# Patient Record
Sex: Female | Born: 1935 | Race: White | Hispanic: No | Smoking: Former smoker
Health system: Southern US, Community
[De-identification: ages and names within clinical notes are randomized; demographics above are authoritative.]

## PROBLEM LIST (undated history)

## (undated) DIAGNOSIS — F32A Depression, unspecified: Secondary | ICD-10-CM

## (undated) DIAGNOSIS — I10 Essential (primary) hypertension: Secondary | ICD-10-CM

## (undated) DIAGNOSIS — Z8673 Personal history of transient ischemic attack (TIA), and cerebral infarction without residual deficits: Secondary | ICD-10-CM

## (undated) DIAGNOSIS — K5730 Diverticulosis of large intestine without perforation or abscess without bleeding: Secondary | ICD-10-CM

## (undated) DIAGNOSIS — F0390 Unspecified dementia without behavioral disturbance: Secondary | ICD-10-CM

## (undated) DIAGNOSIS — L408 Other psoriasis: Secondary | ICD-10-CM

## (undated) DIAGNOSIS — F329 Major depressive disorder, single episode, unspecified: Secondary | ICD-10-CM

## (undated) HISTORY — DX: Personal history of transient ischemic attack (TIA), and cerebral infarction without residual deficits: Z86.73

## (undated) HISTORY — DX: Unspecified dementia, unspecified severity, without behavioral disturbance, psychotic disturbance, mood disturbance, and anxiety: F03.90

## (undated) HISTORY — DX: Depression, unspecified: F32.A

## (undated) HISTORY — DX: Essential (primary) hypertension: I10

## (undated) HISTORY — DX: Major depressive disorder, single episode, unspecified: F32.9

## (undated) HISTORY — DX: Other psoriasis: L40.8

## (undated) HISTORY — DX: Diverticulosis of large intestine without perforation or abscess without bleeding: K57.30

---

## 1998-02-26 ENCOUNTER — Emergency Department (HOSPITAL_COMMUNITY): Admission: EM | Admit: 1998-02-26 | Discharge: 1998-02-26 | Payer: Self-pay | Admitting: Emergency Medicine

## 2006-04-10 ENCOUNTER — Emergency Department (HOSPITAL_COMMUNITY): Admission: EM | Admit: 2006-04-10 | Discharge: 2006-04-11 | Payer: Self-pay | Admitting: Emergency Medicine

## 2007-01-11 ENCOUNTER — Ambulatory Visit: Payer: Self-pay | Admitting: Endocrinology

## 2007-01-25 ENCOUNTER — Ambulatory Visit: Payer: Self-pay | Admitting: Endocrinology

## 2007-02-08 ENCOUNTER — Ambulatory Visit: Payer: Self-pay | Admitting: Endocrinology

## 2007-03-19 ENCOUNTER — Ambulatory Visit: Payer: Self-pay | Admitting: Endocrinology

## 2007-05-21 ENCOUNTER — Ambulatory Visit: Payer: Self-pay | Admitting: Endocrinology

## 2007-05-23 DIAGNOSIS — I10 Essential (primary) hypertension: Secondary | ICD-10-CM | POA: Insufficient documentation

## 2007-05-23 DIAGNOSIS — F329 Major depressive disorder, single episode, unspecified: Secondary | ICD-10-CM

## 2007-05-23 DIAGNOSIS — K5730 Diverticulosis of large intestine without perforation or abscess without bleeding: Secondary | ICD-10-CM | POA: Insufficient documentation

## 2007-05-23 DIAGNOSIS — F068 Other specified mental disorders due to known physiological condition: Secondary | ICD-10-CM

## 2007-05-23 DIAGNOSIS — Z8679 Personal history of other diseases of the circulatory system: Secondary | ICD-10-CM | POA: Insufficient documentation

## 2007-07-27 ENCOUNTER — Encounter: Payer: Self-pay | Admitting: Endocrinology

## 2007-07-27 ENCOUNTER — Ambulatory Visit: Payer: Self-pay | Admitting: Endocrinology

## 2007-10-05 ENCOUNTER — Ambulatory Visit: Payer: Self-pay | Admitting: Endocrinology

## 2007-10-05 IMAGING — CR DG ELBOW COMPLETE 3+V*L*
4 series · 4 of 4 positions shown · non-contrast
Comparison: none

CLINICAL DATA: Posterior left elbow pain and bruising following a fall.

LEFT ELBOW - 4 VIEW

[view not recorded (1 of 4)]
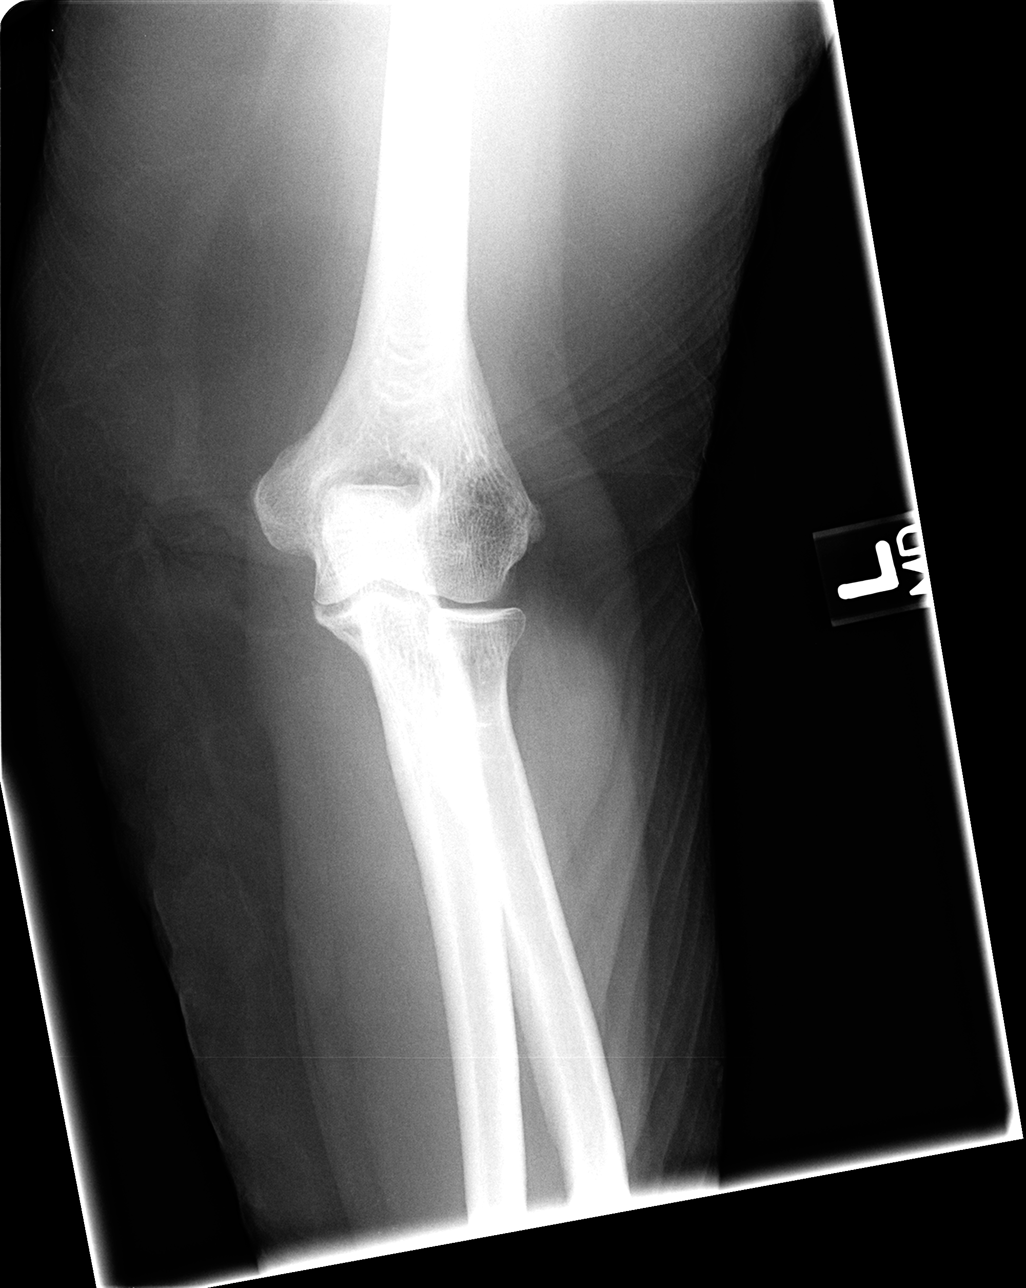

[view not recorded (2 of 4)]
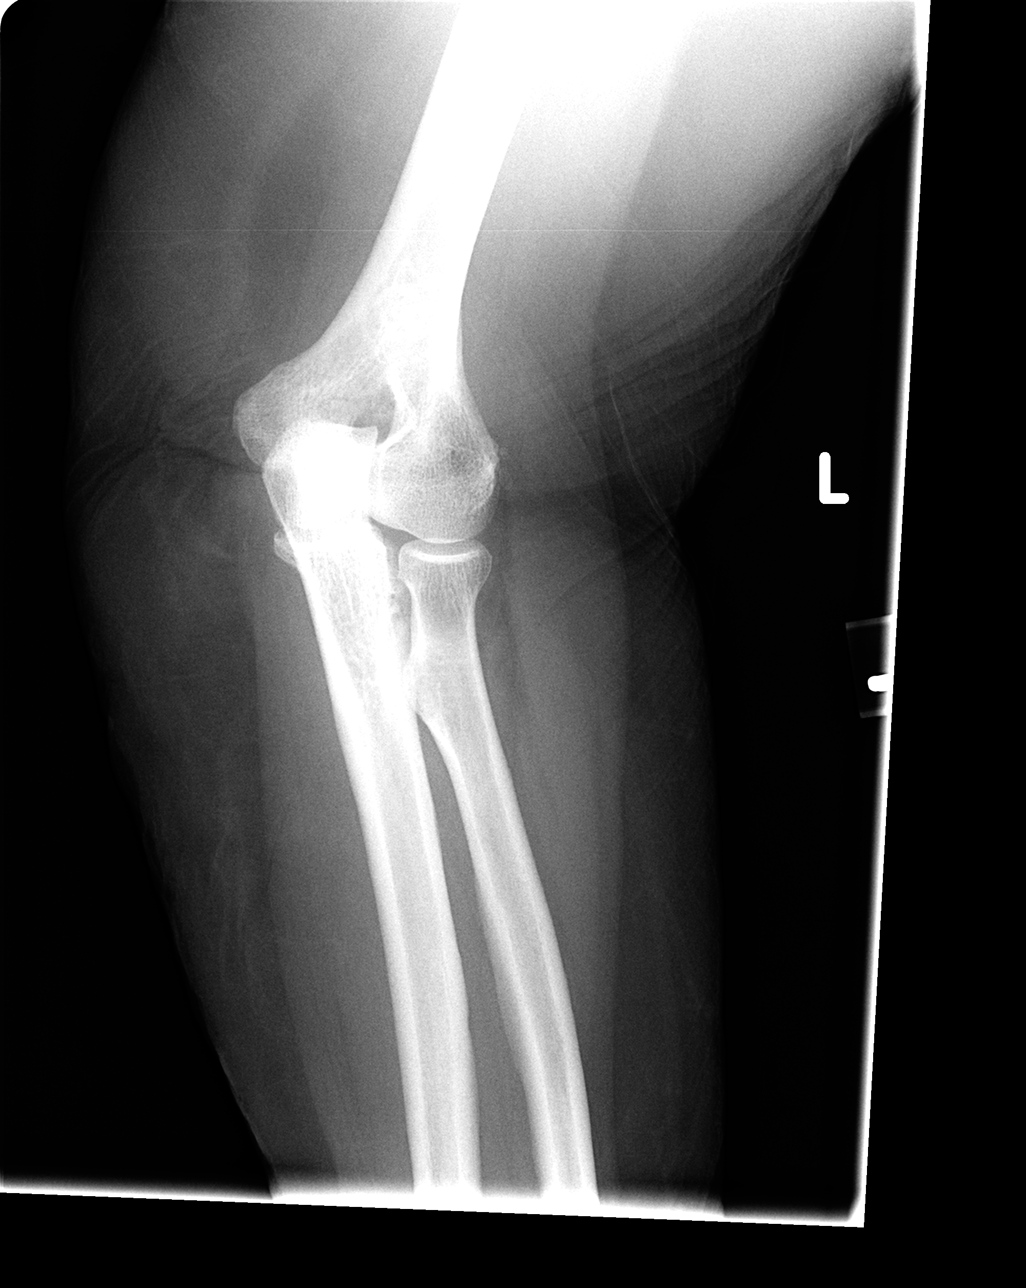

[view not recorded (3 of 4)]
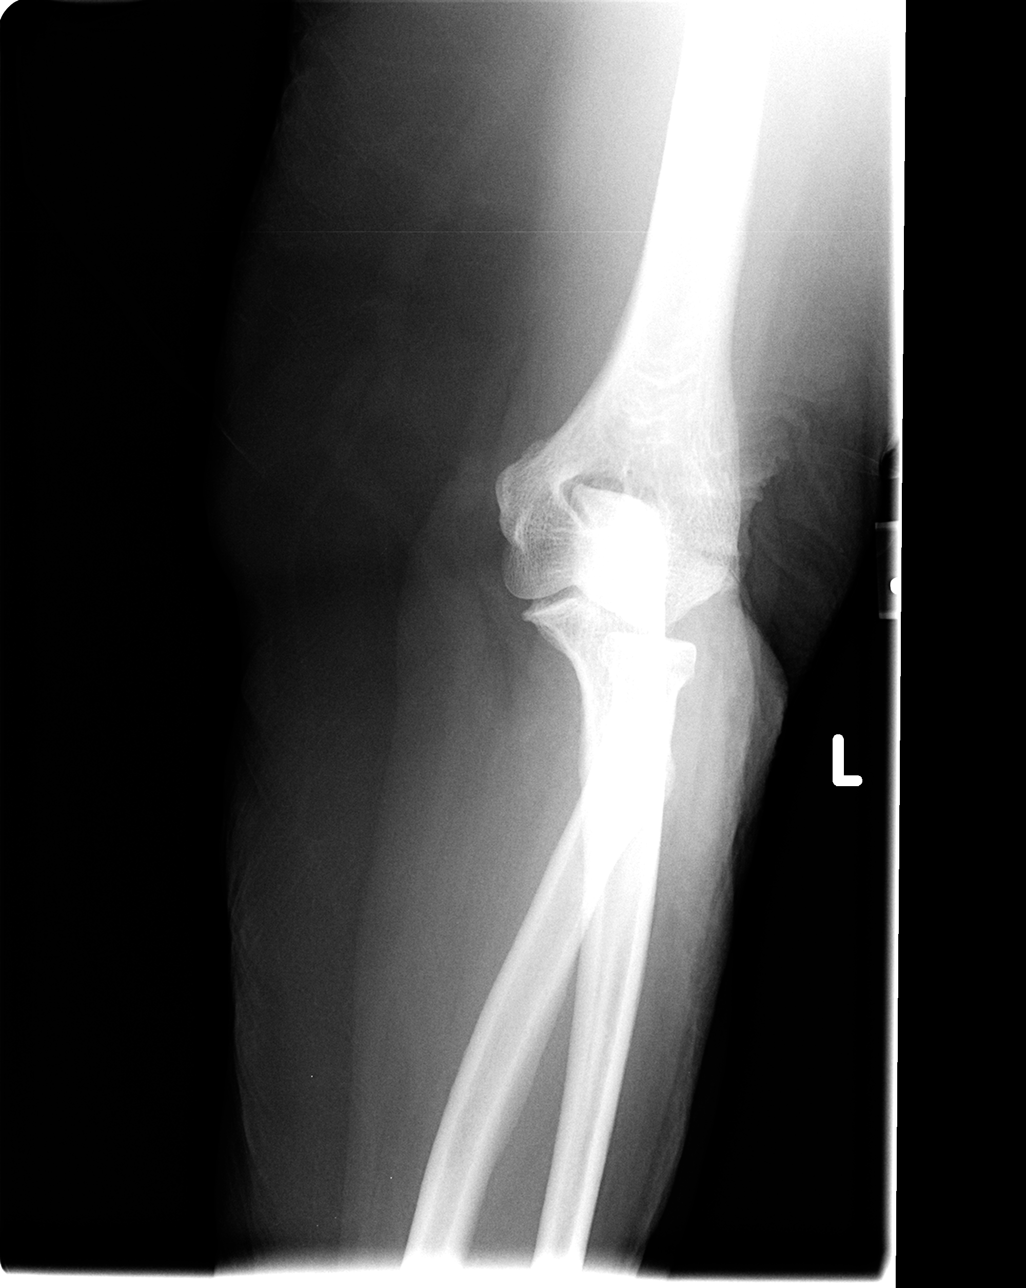

[view not recorded (4 of 4)]
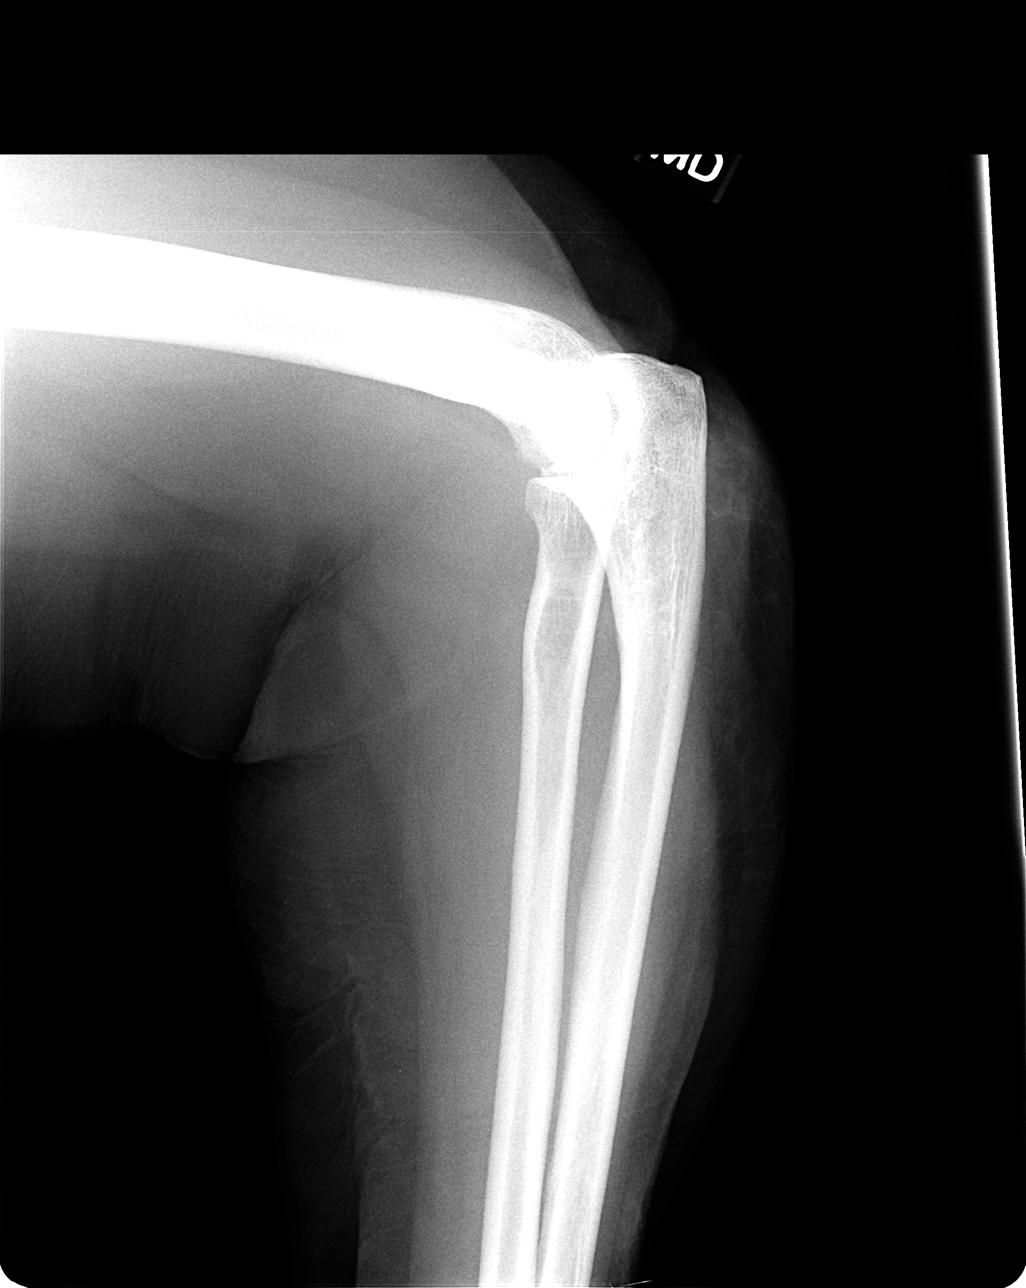

[4 of 4 positions shown; findings below may reference images not displayed]

FINDINGS: Posterior soft tissue swelling. No fracture, dislocation or effusion
seen.

IMPRESSION

No fracture.

## 2007-11-15 ENCOUNTER — Encounter: Payer: Self-pay | Admitting: Endocrinology

## 2008-01-04 ENCOUNTER — Ambulatory Visit: Payer: Self-pay | Admitting: Endocrinology

## 2008-02-10 ENCOUNTER — Encounter: Payer: Self-pay | Admitting: Endocrinology

## 2008-02-15 ENCOUNTER — Ambulatory Visit: Payer: Self-pay | Admitting: Endocrinology

## 2008-05-17 ENCOUNTER — Encounter: Payer: Self-pay | Admitting: Endocrinology

## 2008-05-18 ENCOUNTER — Ambulatory Visit: Payer: Self-pay | Admitting: Endocrinology

## 2008-07-06 ENCOUNTER — Ambulatory Visit: Payer: Self-pay | Admitting: Endocrinology

## 2008-07-28 ENCOUNTER — Ambulatory Visit: Payer: Self-pay | Admitting: Endocrinology

## 2008-08-22 ENCOUNTER — Encounter: Payer: Self-pay | Admitting: Endocrinology

## 2008-11-21 ENCOUNTER — Ambulatory Visit: Payer: Self-pay | Admitting: Endocrinology

## 2008-12-21 ENCOUNTER — Telehealth (INDEPENDENT_AMBULATORY_CARE_PROVIDER_SITE_OTHER): Payer: Self-pay | Admitting: *Deleted

## 2008-12-25 ENCOUNTER — Encounter: Payer: Self-pay | Admitting: Endocrinology

## 2009-02-20 ENCOUNTER — Ambulatory Visit: Payer: Self-pay | Admitting: Endocrinology

## 2009-02-20 LAB — CONVERTED CEMR LAB: Hgb A1c MFr Bld: 8.5 % — ABNORMAL HIGH (ref 4.6–6.5)

## 2009-03-27 ENCOUNTER — Ambulatory Visit: Payer: Self-pay | Admitting: Endocrinology

## 2009-05-03 ENCOUNTER — Ambulatory Visit: Payer: Self-pay | Admitting: Endocrinology

## 2009-08-10 ENCOUNTER — Ambulatory Visit: Payer: Self-pay | Admitting: Endocrinology

## 2009-08-29 ENCOUNTER — Encounter: Payer: Self-pay | Admitting: Endocrinology

## 2009-11-14 ENCOUNTER — Encounter: Payer: Self-pay | Admitting: Endocrinology

## 2009-11-14 LAB — CONVERTED CEMR LAB: Hgb A1c MFr Bld: 8.5 %

## 2009-11-16 ENCOUNTER — Ambulatory Visit: Payer: Self-pay | Admitting: Endocrinology

## 2009-12-31 ENCOUNTER — Ambulatory Visit: Payer: Self-pay | Admitting: Endocrinology

## 2010-01-15 ENCOUNTER — Encounter: Payer: Self-pay | Admitting: Endocrinology

## 2010-02-04 ENCOUNTER — Ambulatory Visit: Payer: Self-pay | Admitting: Endocrinology

## 2010-02-20 ENCOUNTER — Encounter: Payer: Self-pay | Admitting: Endocrinology

## 2010-02-20 ENCOUNTER — Encounter (INDEPENDENT_AMBULATORY_CARE_PROVIDER_SITE_OTHER): Payer: Self-pay | Admitting: *Deleted

## 2010-02-20 LAB — CONVERTED CEMR LAB: Hgb A1c MFr Bld: 7.9 %

## 2010-02-22 ENCOUNTER — Encounter (INDEPENDENT_AMBULATORY_CARE_PROVIDER_SITE_OTHER): Payer: Self-pay | Admitting: *Deleted

## 2010-03-22 ENCOUNTER — Encounter (INDEPENDENT_AMBULATORY_CARE_PROVIDER_SITE_OTHER): Payer: Self-pay | Admitting: *Deleted

## 2010-03-22 ENCOUNTER — Ambulatory Visit: Payer: Self-pay | Admitting: Endocrinology

## 2010-05-15 ENCOUNTER — Encounter: Payer: Self-pay | Admitting: Endocrinology

## 2010-05-17 ENCOUNTER — Ambulatory Visit: Payer: Self-pay | Admitting: Endocrinology

## 2010-06-01 ENCOUNTER — Encounter: Payer: Self-pay | Admitting: Endocrinology

## 2010-08-22 ENCOUNTER — Encounter: Payer: Self-pay | Admitting: Endocrinology

## 2010-08-23 ENCOUNTER — Ambulatory Visit: Payer: Self-pay | Admitting: Endocrinology

## 2010-11-28 ENCOUNTER — Encounter: Payer: Self-pay | Admitting: Endocrinology

## 2010-11-29 ENCOUNTER — Ambulatory Visit
Admission: RE | Admit: 2010-11-29 | Discharge: 2010-11-29 | Payer: Self-pay | Source: Home / Self Care | Attending: Endocrinology | Admitting: Endocrinology

## 2010-11-29 DIAGNOSIS — L408 Other psoriasis: Secondary | ICD-10-CM

## 2010-11-29 HISTORY — DX: Other psoriasis: L40.8

## 2010-12-10 NOTE — Assessment & Plan Note (Signed)
Summary: BS IS 50-70/ PER SON/NWS   Vital Signs:  Patient profile:   77 year old female Height:      63 inches (160.02 cm) Weight:      190 pounds (86.36 kg) O2 Sat:      96 % on Room air Temp:     98.3 degrees F (36.83 degrees C) oral Pulse rate:   66 / minute BP sitting:   122 / 54  (left arm) Cuff size:   large  Vitals Entered By: Orlan Leavens (February 04, 2010 3:42 PM) Sydell Axon SMA  O2 Flow:  Room air CC: pt c/o low blood sugar, CBG in office [address]   Referring Provider:  r. orr Primary Provider:  r. orr  CC:  pt c/o low blood sugar and CBG in office [address].  History of Present Illness: pt is having mild hypoglycemia before breakfast and before lunch (51-81). no loc.  aside from scabies rx, she has not been ill recently.  Current Medications (verified): 1)  Paxil 20 Mg  Tabs (Paroxetine Hcl) .... Take 1 By Mouth Qd 2)  Tenormin 50 Mg  Tabs (Atenolol) .... Take 1 By Mouth Qd 3)  Furosemide 20 Mg  Tabs (Furosemide) .... Take 1 By Mouth Qd 4)  Benazepril Hcl 20 Mg  Tabs (Benazepril Hcl) .... Take 1 By Mouth Qd 5)  Macrobid 100 Mg  Caps (Nitrofurantoin Monohyd Macro) .... Take 1 By Mouth Qod 6)  Humalog 100 Unit/ml  Soln (Insulin Lispro (Human)) .... Qac (Three Times A Day) 55-25-90 Units 7)  Cipro 500 Mg Tabs (Ciprofloxacin Hcl) .... 2 Daily 8)  Levemir 100 Unit/ml Soln (Insulin Detemir) .... 80 Units Qhs  Allergies (verified): No Known Drug Allergies  Past History:  Past Medical History: Last updated: 05/23/2007 Dementia Depression Diabetes mellitus, type I Diverticulosis, colon Hypertension Cerebrovascular accident, hx of  Family History: Reviewed history and no changes required. several sons have dm  Social History: Reviewed history and no changes required. widowed retired  Review of Systems  The patient denies weight loss and weight gain.    Physical Exam  General:  obese.  in wheeelchair. no distress. Neck:  Supple without thyroid  enlargement or tenderness.  Psych:  does not speak   Impression & Recommendations:  Problem # 1:  DIABETES MELLITUS, TYPE I (ICD-250.01) overcontrolled in am  Medications Added to Medication List This Visit: 1)  Levemir 100 Unit/ml Soln (Insulin detemir) .... 70 units at bedtime  Other Orders: Est. Patient Level III (16109)  Patient Instructions: 1)  reduce levemir to 70 unit units at bedtime 2)  continue humalog (just before each meal) 55-25-90 units 3)  take 5 extra units of humalog qac and at bedtime, as needed for any cbg over 250 4)  return 1 month. 5)  please call sooner for any cbg below 80, and tell us what time of day.

## 2010-12-10 NOTE — Assessment & Plan Note (Signed)
Summary: 3 mth fu---stc   Vital Signs:  Patient profile:   77 year old female Height:      63 inches Weight:      191 pounds BMI:     33.96 O2 Sat:      94 % on Room air Temp:     98.3 degrees F oral Pulse rate:   65 / minute BP sitting:   122 / 72  (left arm) Cuff size:   large  Vitals Entered By: Brenton Grills MA (August 23, 2010 2:08 PM)  O2 Flow:  Room air CC: 3 month F/U/aj Is Patient Diabetic? Yes   Referring Provider:  r. orr Primary Provider:  r. orr  CC:  3 month F/U/aj.  History of Present Illness: pt states she feels well in general.  she brings a record of her cbg's, from her place of residence, which i have reviewed today.  it is lowest in the afternoon (sometimes 70-85), and most consistently high at hs (200).  Current Medications (verified): 1)  Paxil 20 Mg  Tabs (Paroxetine Hcl) .... Take 1 By Mouth Qd 2)  Tenormin 50 Mg  Tabs (Atenolol) .... Take 1 By Mouth Qd 3)  Furosemide 20 Mg  Tabs (Furosemide) .... Take 1 By Mouth Two Times A Day 4)  Benazepril Hcl 20 Mg  Tabs (Benazepril Hcl) .... Take 1 By Mouth Qd 5)  Macrobid 100 Mg  Caps (Nitrofurantoin Monohyd Macro) .... Take 1 By Mouth Qod 6)  Humalog 100 Unit/ml  Soln (Insulin Lispro (Human)) .... Qac (Three Times A Day) 45-30-95 Units 7)  Cipro 500 Mg Tabs (Ciprofloxacin Hcl) .... 2 Daily 8)  Levemir 100 Unit/ml Soln (Insulin Detemir) .... 70 Units At Bedtime 9)  Colace 100 Mg Caps (Docusate Sodium) .Marland Kitchen.. 1 Capsule At Bedtime 10)  Tenormin 12.5mg  .... 1 Tab At Bedtime 11)  Aspir-Low 81 Mg Tbec (Aspirin) .Marland Kitchen.. 1 Tab By Mouth 12)  Acidophilus  Caps (Lactobacillus) .Marland Kitchen.. 1 Cap By Mouth 13)  0.12% Chlorhexide .... Brush On Teeth and Gums Before Meals and Bedtime As Needed 14)  Claritin 10 Mg Tabs (Loratadine) .Marland Kitchen.. 1 Tab By Mouth 15)  Diphenhydramine Hcl 25 Mg Caps (Diphenhydramine Hcl) .Marland Kitchen.. 1 By Mouth Q 4 Hrs As Needed Itching 16)  Eucerin  Crea (Skin Protectants, Misc.) .... As Needed For Itch 17)  Lorazepam  0.5 Mg Tabs (Lorazepam) .Marland Kitchen.. 1 By Mouth Every 4 Hours As Needed  Allergies (verified): No Known Drug Allergies  Past History:  Past Medical History: Last updated: 05/23/2007 Dementia Depression Diabetes mellitus, type I Diverticulosis, colon Hypertension Cerebrovascular accident, hx of  Review of Systems  The patient denies syncope.    Physical Exam  General:  obese.  no distress.  in wheelchair. Neck:  Supple without thyroid enlargement or tenderness.  Additional Exam:  a1c=8.4   Impression & Recommendations:  Problem # 1:  DIABETES MELLITUS, TYPE I (ICD-250.01) needs increased rx  Medications Added to Medication List This Visit: 1)  Lorazepam 0.5 Mg Tabs (Lorazepam) .Marland Kitchen.. 1 by mouth every 4 hours as needed  Other Orders: Est. Patient Level III (47829)  Patient Instructions: 1)  continue levemir 70 unit units at bedtime 2)  change humalog to (just before each meal) 45-28-105 units 3)  take 5 extra units of humalog qac and at bedtime, as needed for any cbg over 250. 4)  return 3 months. 5)  please call sooner for any cbg below 80, and tell us what time of day. 6)  for any cbg less than 100, give 1/2 the scheduled dose.

## 2010-12-10 NOTE — Miscellaneous (Signed)
Summary: A1C  Clinical Lists Changes  Observations: Added new observation of HGBA1C: 8.5 % (11/14/2009 16:24)      -  Date:  11/14/2009    HbA1c: 8.5 [facility]/ CF

## 2010-12-10 NOTE — Assessment & Plan Note (Signed)
Summary: 3 MTH FU--D/T---STC   Vital Signs:  Patient profile:   77 year old female Height:      63 inches (160.02 cm) Weight:      190 pounds O2 Sat:      95 % on Room air Temp:     97.9 degrees F (36.61 degrees C) oral Pulse rate:   72 / minute BP sitting:   120 / 78  (left arm) Cuff size:   large  Vitals Entered By: Josph Macho RMA (Mar 22, 2010 1:53 PM)  O2 Flow:  Room air CC: 3 month follow up/ pt is no longer taking Cipro/ CF Is Patient Diabetic? Yes   Referring Provider:  r. orr Primary Provider:  r. orr  CC:  3 month follow up/ pt is no longer taking Cipro/ CF.  History of Present Illness: husband brings a record of his cbg's which i have reviewed today.  it varies from 64 (before lunch) to 200) suppar and hs.  however, most are 100's.  husb says insulin is being witheld for a cbg less than 100.     Current Medications (verified): 1)  Paxil 20 Mg  Tabs (Paroxetine Hcl) .... Take 1 By Mouth Qd 2)  Tenormin 50 Mg  Tabs (Atenolol) .... Take 1 By Mouth Qd 3)  Furosemide 20 Mg  Tabs (Furosemide) .... Take 1 By Mouth Qd 4)  Benazepril Hcl 20 Mg  Tabs (Benazepril Hcl) .... Take 1 By Mouth Qd 5)  Macrobid 100 Mg  Caps (Nitrofurantoin Monohyd Macro) .... Take 1 By Mouth Qod 6)  Humalog 100 Unit/ml  Soln (Insulin Lispro (Human)) .... Qac (Three Times A Day) 55-25-90 Units 7)  Cipro 500 Mg Tabs (Ciprofloxacin Hcl) .... 2 Daily 8)  Levemir 100 Unit/ml Soln (Insulin Detemir) .... 70 Units At Bedtime 9)  Colace 100 Mg Caps (Docusate Sodium) .Marland Kitchen.. 1 Capsule At Bedtime 10)  Tenormin 12.5mg  .... 1 Tab At Bedtime 11)  Aspir-Low 81 Mg Tbec (Aspirin) .Marland Kitchen.. 1 Tab By Mouth 12)  Acidophilus  Caps (Lactobacillus) .Marland Kitchen.. 1 Cap By Mouth 13)  0.12% Chlorhexide .... Brush On Teeth and Gums Before Meals and Bedtime As Needed 14)  Claritin 10 Mg Tabs (Loratadine) .Marland Kitchen.. 1 Tab By Mouth 15)  Diphenhydramine Hcl 25 Mg Caps (Diphenhydramine Hcl) .Marland Kitchen.. 1 By Mouth Q 4 Hrs As Needed Itching 16)  Eucerin   Crea (Skin Protectants, Misc.) .... As Needed For Itch  Allergies (verified): No Known Drug Allergies  Past History:  Past Medical History: Last updated: 05/23/2007 Dementia Depression Diabetes mellitus, type I Diverticulosis, colon Hypertension Cerebrovascular accident, hx of  Review of Systems  The patient denies syncope.    Physical Exam  General:  obese.  no distress.  does not speak.  in wheelchair. Skin:  insulin injection sites at anterior abdomen are normal  Additional Exam:  a1c (last month)=7.9   Impression & Recommendations:  Problem # 1:  DIABETES MELLITUS, TYPE I (ICD-250.01) she needs some minor adjustments in her therapy  Medications Added to Medication List This Visit: 1)  Humalog 100 Unit/ml Soln (Insulin lispro (human)) .... Qac (three times a day) 50-25-95 units 2)  Colace 100 Mg Caps (Docusate sodium) .Marland Kitchen.. 1 capsule at bedtime 3)  Tenormin 12.5mg   .... 1 tab at bedtime 4)  Aspir-low 81 Mg Tbec (Aspirin) .Marland Kitchen.. 1 tab by mouth 5)  Acidophilus Caps (Lactobacillus) .Marland Kitchen.. 1 cap by mouth 6)  0.12% Chlorhexide  .... Brush on teeth and gums before meals and  bedtime as needed 7)  Claritin 10 Mg Tabs (Loratadine) .Marland Kitchen.. 1 tab by mouth 8)  Diphenhydramine Hcl 25 Mg Caps (Diphenhydramine hcl) .Marland Kitchen.. 1 by mouth q 4 hrs as needed itching 9)  Eucerin Crea (Skin protectants, misc.) .... As needed for itch  Other Orders: Est. Patient Level III (52841)  Patient Instructions: 1)  continue levemir 70 unit units at bedtime 2)  change humalog to (just before each meal) 50-25-95 units 3)  take 5 extra units of humalog qac and at bedtime, as needed for any cbg over 250. 4)  return 1 months. 5)  please call sooner for any cbg below 80, and tell us what time of day. 6)  for any cbg less than 100, give 1/2 the scheduled dose.  Appended Document: 3 MTH FU--D/T---STC correction: return 6 weks

## 2010-12-10 NOTE — Miscellaneous (Signed)
Summary: Lab results A1C  Clinical Lists Changes  Observations: Added new observation of HGBA1C: 7.9 % (02/20/2010 8:13)      -  Date:  02/20/2010    HbA1c: 7.9

## 2010-12-10 NOTE — Assessment & Plan Note (Signed)
Summary: 3 MO ROV /NWS #   Vital Signs:  Patient profile:   77 year old female Height:      63 inches (160.02 cm) O2 Sat:      96 % on Room air Temp:     96.8 degrees F (36 degrees C) oral Pulse rate:   70 / minute BP sitting:   142 / 68  (left arm) Cuff size:   large  Vitals Entered By: Josph Macho CMA (November 16, 2009 1:57 PM)  O2 Flow:  Room air CC: 3 month follow up/ CF Is Patient Diabetic? Yes   Referring Provider:  r. orr Primary Provider:  r. orr  CC:  3 month follow up/ CF.  History of Present Illness: pt states she feels well in general.  she brings a record of her cbg's which i have reviewed today.  it varies from 118-270.  it is lowest in am and highest at hs.  Current Medications (verified): 1)  Paxil 20 Mg  Tabs (Paroxetine Hcl) .... Take 1 By Mouth Qd 2)  Tenormin 50 Mg  Tabs (Atenolol) .... Take 1 By Mouth Qd 3)  Furosemide 20 Mg  Tabs (Furosemide) .... Take 1 By Mouth Qd 4)  Benazepril Hcl 20 Mg  Tabs (Benazepril Hcl) .... Take 1 By Mouth Qd 5)  Macrobid 100 Mg  Caps (Nitrofurantoin Monohyd Macro) .... Take 1 By Mouth Qod 6)  Humalog 100 Unit/ml  Soln (Insulin Lispro (Human)) .... Qac Three Times A Day) 40-25-75 7)  Cipro 500 Mg Tabs (Ciprofloxacin Hcl) .... 2 Daily 8)  Levemir 100 Unit/ml Soln (Insulin Detemir) .... 80 Units Qhs  Allergies (verified): No Known Drug Allergies  Past History:  Past Medical History: Last updated: 05/23/2007 Dementia Depression Diabetes mellitus, type I Diverticulosis, colon Hypertension Cerebrovascular accident, hx of  Review of Systems  The patient denies hypoglycemia.    Physical Exam  General:  elderly, frail, no distress.  in wheelchair. Skin:  insulin injection sites at anterior abdomen are normal  Additional Exam:  outside test results are reviewed: a1c=8.5   Impression & Recommendations:  Problem # 1:  DIABETES MELLITUS, TYPE I (ICD-250.01) needs increased rx  Medications Added to  Medication List This Visit: 1)  Humalog 100 Unit/ml Soln (Insulin lispro (human)) .... Qac three times a day) 45-30-80  Other Orders: Est. Patient Level III (04540)  Patient Instructions: 1)  continue levemir 80 unit units at bedtime 2)  increase humalog to (just before each meal) 45-30-80 units 3)  take 5 extra units of humalog qac and at bedtime, as needed any cbg over 250 4)  return 6 weeks  Preventive Care Screening  Last Flu Shot:    Date:  08/10/2009    Results:  historical

## 2010-12-10 NOTE — Assessment & Plan Note (Signed)
Summary: PER SON--6 WK FU--STC   Vital Signs:  Patient profile:   77 year old female Height:      63 inches (160.02 cm) O2 Sat:      96 % on Room air Temp:     97.3 degrees F (36.28 degrees C) oral Pulse rate:   62 / minute BP sitting:   128 / 68  (left arm) Cuff size:   large  Vitals Entered By: Josph Macho RMA (December 31, 2009 11:15 AM)  O2 Flow:  Room air CC: 6 week follow up/ CF Is Patient Diabetic? Yes   Referring Maudie Shingledecker:  r. orr Primary Sissi Padia:  r. orr  CC:  6 week follow up/ CF.  History of Present Illness: husband brings a record of her cbg's which i have reviewed today.  it varies from 100-200.  it is highest before lunch, and at hs.  pt says she feels well in general.    Current Medications (verified): 1)  Paxil 20 Mg  Tabs (Paroxetine Hcl) .... Take 1 By Mouth Qd 2)  Tenormin 50 Mg  Tabs (Atenolol) .... Take 1 By Mouth Qd 3)  Furosemide 20 Mg  Tabs (Furosemide) .... Take 1 By Mouth Qd 4)  Benazepril Hcl 20 Mg  Tabs (Benazepril Hcl) .... Take 1 By Mouth Qd 5)  Macrobid 100 Mg  Caps (Nitrofurantoin Monohyd Macro) .... Take 1 By Mouth Qod 6)  Humalog 100 Unit/ml  Soln (Insulin Lispro (Human)) .... Qac Three Times A Day) 45-30-80 7)  Cipro 500 Mg Tabs (Ciprofloxacin Hcl) .... 2 Daily 8)  Levemir 100 Unit/ml Soln (Insulin Detemir) .... 80 Units Qhs  Allergies (verified): No Known Drug Allergies  Past History:  Past Medical History: Last updated: 05/23/2007 Dementia Depression Diabetes mellitus, type I Diverticulosis, colon Hypertension Cerebrovascular accident, hx of  Review of Systems  The patient denies hypoglycemia.    Physical Exam  General:  elderly, frail, no distress.  in wheelchair. Pulses:  dorsalis pedis intact bilat.   Extremities:  no deformity.  no ulcer on the feet.  feet are of normal color and temp.  1+ right pedal edema and trace left pedal edema.   Neurologic:  sensation is intact to touch on the feet, but decreased from  normal on the left.    Impression & Recommendations:  Problem # 1:  DIABETES MELLITUS, TYPE I (ICD-250.01) needs increased rx  Medications Added to Medication List This Visit: 1)  Humalog 100 Unit/ml Soln (Insulin lispro (human)) .... Qac (three times a day) 55-25-90 units  Other Orders: Est. Patient Level III (30160)  Patient Instructions: 1)  continue levemir 80 unit units at bedtime 2)  increase humalog to (just before each meal) 55-25-90 units 3)  take 5 extra units of humalog qac and at bedtime, as needed any cbg over 250 4)  return 3 months.

## 2010-12-10 NOTE — Assessment & Plan Note (Signed)
Summary: PER SON 6 WK FU---D/T---STC   Vital Signs:  Patient profile:   77 year old female Height:      63 inches (160.02 cm) Weight:      191 pounds (86.82 kg) BMI:     33.96 O2 Sat:      95 % on Room air Temp:     98.2 degrees F (36.78 degrees C) oral Pulse rate:   77 / minute BP sitting:   108 / 62  (left arm) Cuff size:   regular  Vitals Entered By: Brenton Grills MA (May 17, 2010 2:04 PM)  O2 Flow:  Room air CC: 6 week f/u/pt no longer taking Macrobid or Cipro/aj   Referring Provider:  r. orr Primary Provider:  r. orr  CC:  6 week f/u/pt no longer taking Macrobid or Cipro/aj.  History of Present Illness: husband brings a record of pt's cbg's which i have reviewed today.  it varies from 75-250.  it is lowest before lunch, and highest at hs.  pt states she feels well in general, except for a recent uti.    Current Medications (verified): 1)  Paxil 20 Mg  Tabs (Paroxetine Hcl) .... Take 1 By Mouth Qd 2)  Tenormin 50 Mg  Tabs (Atenolol) .... Take 1 By Mouth Qd 3)  Furosemide 20 Mg  Tabs (Furosemide) .... Take 1 By Mouth Two Times A Day 4)  Benazepril Hcl 20 Mg  Tabs (Benazepril Hcl) .... Take 1 By Mouth Qd 5)  Macrobid 100 Mg  Caps (Nitrofurantoin Monohyd Macro) .... Take 1 By Mouth Qod 6)  Humalog 100 Unit/ml  Soln (Insulin Lispro (Human)) .... Qac (Three Times A Day) 50-25-95 Units 7)  Cipro 500 Mg Tabs (Ciprofloxacin Hcl) .... 2 Daily 8)  Levemir 100 Unit/ml Soln (Insulin Detemir) .... 70 Units At Bedtime 9)  Colace 100 Mg Caps (Docusate Sodium) .Marland Kitchen.. 1 Capsule At Bedtime 10)  Tenormin 12.5mg  .... 1 Tab At Bedtime 11)  Aspir-Low 81 Mg Tbec (Aspirin) .Marland Kitchen.. 1 Tab By Mouth 12)  Acidophilus  Caps (Lactobacillus) .Marland Kitchen.. 1 Cap By Mouth 13)  0.12% Chlorhexide .... Brush On Teeth and Gums Before Meals and Bedtime As Needed 14)  Claritin 10 Mg Tabs (Loratadine) .Marland Kitchen.. 1 Tab By Mouth 15)  Diphenhydramine Hcl 25 Mg Caps (Diphenhydramine Hcl) .Marland Kitchen.. 1 By Mouth Q 4 Hrs As Needed  Itching 16)  Eucerin  Crea (Skin Protectants, Misc.) .... As Needed For Itch  Allergies (verified): No Known Drug Allergies  Past History:  Past Medical History: Last updated: 05/23/2007 Dementia Depression Diabetes mellitus, type I Diverticulosis, colon Hypertension Cerebrovascular accident, hx of  Review of Systems  The patient denies hypoglycemia.    Physical Exam  General:  obese.  no distress.  in wheelchair Pulses:  dorsalis pedis intact bilat.   Extremities:  no deformity.  no ulcer on the feet.  feet are of normal color and temp.  1+ right pedal edema and trace left pedal edema.   Neurologic:  sensation is intact to touch on the feet, but decreased from normal on the right.  Additional Exam:  outside test results are reviewed:  a1c=7.3%   Impression & Recommendations:  Problem # 1:  DIABETES MELLITUS, TYPE I (ICD-250.01) she needs some adjustment in her therapy  Medications Added to Medication List This Visit: 1)  Humalog 100 Unit/ml Soln (Insulin lispro (human)) .... Qac (three times a day) 45-30-95 units  Other Orders: Est. Patient Level III (51884)  Patient Instructions: 1)  continue levemir 70 unit units at bedtime 2)  change humalog to (just before each meal) 45-30-95 units 3)  take 5 extra units of humalog qac and at bedtime, as needed for any cbg over 250. 4)  return 3 months. 5)  please call sooner for any cbg below 80, and tell us what time of day. 6)  for any cbg less than 100, give 1/2 the scheduled dose.

## 2010-12-10 NOTE — Miscellaneous (Signed)
Summary: Med update  Clinical Lists Changes  Medications: Changed medication from FUROSEMIDE 20 MG  TABS (FUROSEMIDE) take 1 by mouth qd to FUROSEMIDE 20 MG  TABS (FUROSEMIDE) take 1 by mouth two times a day Observations: Added new observation of NKA: T (03/22/2010 15:49) Added new observation of ALLERGY REV: Done (03/22/2010 15:49) Added new observation of MEDRECON: current updated (03/22/2010 15:49)       Current Medications (verified): 1)  Paxil 20 Mg  Tabs (Paroxetine Hcl) .... Take 1 By Mouth Qd 2)  Tenormin 50 Mg  Tabs (Atenolol) .... Take 1 By Mouth Qd 3)  Furosemide 20 Mg  Tabs (Furosemide) .... Take 1 By Mouth Two Times A Day 4)  Benazepril Hcl 20 Mg  Tabs (Benazepril Hcl) .... Take 1 By Mouth Qd 5)  Macrobid 100 Mg  Caps (Nitrofurantoin Monohyd Macro) .... Take 1 By Mouth Qod 6)  Humalog 100 Unit/ml  Soln (Insulin Lispro (Human)) .... Qac (Three Times A Day) 50-25-95 Units 7)  Cipro 500 Mg Tabs (Ciprofloxacin Hcl) .... 2 Daily 8)  Levemir 100 Unit/ml Soln (Insulin Detemir) .... 70 Units At Bedtime 9)  Colace 100 Mg Caps (Docusate Sodium) .Marland Kitchen.. 1 Capsule At Bedtime 10)  Tenormin 12.5mg  .... 1 Tab At Bedtime 11)  Aspir-Low 81 Mg Tbec (Aspirin) .Marland Kitchen.. 1 Tab By Mouth 12)  Acidophilus  Caps (Lactobacillus) .Marland Kitchen.. 1 Cap By Mouth 13)  0.12% Chlorhexide .... Brush On Teeth and Gums Before Meals and Bedtime As Needed 14)  Claritin 10 Mg Tabs (Loratadine) .Marland Kitchen.. 1 Tab By Mouth 15)  Diphenhydramine Hcl 25 Mg Caps (Diphenhydramine Hcl) .Marland Kitchen.. 1 By Mouth Q 4 Hrs As Needed Itching 16)  Eucerin  Crea (Skin Protectants, Misc.) .... As Needed For Itch  Allergies (verified): No Known Drug Allergies

## 2010-12-12 NOTE — Assessment & Plan Note (Signed)
Summary: 3 mos f/u #?cd   Vital Signs:  Patient profile:   77 year old female Height:      63 inches (160.02 cm) Weight:      191 pounds (86.82 kg) BMI:     33.96 O2 Sat:      95 % on Room air Temp:     98.4 degrees F (36.89 degrees C) oral Pulse rate:   71 / minute Pulse rhythm:   regular BP sitting:   108 / 68  (left arm) Cuff size:   large  Vitals Entered By: Brenton Grills CMA Duncan Dull) (November 29, 2010 1:53 PM)  O2 Flow:  Room air CC: 3 month F/U/aj Is Patient Diabetic? Yes    Referring Provider:  r. orr Primary Provider:  r. orr  CC:  3 month F/U/aj.  History of Present Illness: pt states she feels well in general.  husband brings a record of her cbg's, from her place of residence, which i have reviewed today.  it is highest in the afternoon (156-300).  at all other times of day, she has some cbg as low as the low-100's.  it was high in november and dec, but is improved overall in january.    Current Medications (verified): 1)  Paxil 20 Mg  Tabs (Paroxetine Hcl) .... Take 1 By Mouth Qd 2)  Tenormin 50 Mg  Tabs (Atenolol) .... Take 1 By Mouth Qd 3)  Furosemide 20 Mg  Tabs (Furosemide) .... Take 1 By Mouth Two Times A Day 4)  Benazepril Hcl 20 Mg  Tabs (Benazepril Hcl) .... Take 1 By Mouth Qd 5)  Humalog 100 Unit/ml  Soln (Insulin Lispro (Human)) .... Qac (Three Times A Day) 45-30-95 Units 6)  Levemir 100 Unit/ml Soln (Insulin Detemir) .... 70 Units At Bedtime 7)  Colace 100 Mg Caps (Docusate Sodium) .Marland Kitchen.. 1 Capsule At Bedtime 8)  Tenormin 12.5mg  .... 1 Tab At Bedtime 9)  Aspir-Low 81 Mg Tbec (Aspirin) .Marland Kitchen.. 1 Tab By Mouth 10)  Acidophilus  Caps (Lactobacillus) .Marland Kitchen.. 1 Cap By Mouth 11)  0.12% Chlorhexide .... Brush On Teeth and Gums Before Meals and Bedtime As Needed 12)  Claritin 10 Mg Tabs (Loratadine) .Marland Kitchen.. 1 Tab By Mouth 13)  Diphenhydramine Hcl 25 Mg Caps (Diphenhydramine Hcl) .Marland Kitchen.. 1 By Mouth Q 4 Hrs As Needed Itching 14)  Eucerin  Crea (Skin Protectants, Misc.) ....  As Needed For Itch 15)  Lorazepam 0.5 Mg Tabs (Lorazepam) .Marland Kitchen.. 1 By Mouth Every 4 Hours As Needed  Allergies (verified): No Known Drug Allergies  Past History:  Past Medical History: Last updated: 05/23/2007 Dementia Depression Diabetes mellitus, type I Diverticulosis, colon Hypertension Cerebrovascular accident, hx of  Review of Systems  The patient denies hypoglycemia.    Physical Exam  General:  obese.  no distress.  in wheelchair. Pulses:  dorsalis pedis intact bilat.   Extremities:  no deformity.  no ulcer on the feet.  feet are of normal color and temp.  1+ right pedal edema and trace left pedal edema.  there is a patchy rash on the feet Neurologic:  sensation is intact to touch on the feet, but decreased from normal on the right.  Additional Exam:  outside test results are reviewed:  a1c=8.0   Impression & Recommendations:  Problem # 1:  DIABETES MELLITUS, TYPE I (ICD-250.01) she needs slightly increased rx  Medications Added to Medication List This Visit: 1)  Humalog 100 Unit/ml Soln (Insulin lispro (human)) .... Qac (three times a day)  45-33-105 units  Other Orders: Est. Patient Level III (16109)  Patient Instructions: 1)  continue levemir 70 unit units at bedtime 2)  change humalog to (just before each meal) 45-33-105 units 3)  take 5 extra units of humalog qac and at bedtime, as needed for any cbg over 250. 4)  please divide the supper insulin dose into 2 separate injections.   5)  return 3 months. 6)  please call sooner for any cbg below 80, and tell us what time of day. 7)  for any cbg less than 100, give 1/2 the scheduled dose.   Orders Added: 1)  Est. Patient Level III [60454]

## 2010-12-13 NOTE — Letter (Signed)
Summary: Call A Nurse  Call A Nurse   Imported By: Sherian Rein 06/06/2010 08:51:57  _____________________________________________________________________  External Attachment:    Type:   Image     Comment:   External Document

## 2011-02-28 ENCOUNTER — Encounter: Payer: Self-pay | Admitting: Endocrinology

## 2011-02-28 ENCOUNTER — Ambulatory Visit (INDEPENDENT_AMBULATORY_CARE_PROVIDER_SITE_OTHER): Payer: Medicaid Other | Admitting: Endocrinology

## 2011-02-28 VITALS — BP 118/72 | HR 76 | Temp 98.4°F

## 2011-02-28 DIAGNOSIS — E109 Type 1 diabetes mellitus without complications: Secondary | ICD-10-CM

## 2011-02-28 NOTE — Progress Notes (Signed)
  Subjective:    Patient ID: Lisa Walls, female    DOB: Nov 27, 1922, 77 y.o.   MRN: [id number]  HPI pt says she feels well in general.  she brings an extensive record of her cbg's which i have reviewed today.  It is sometimes as low as the 60's, usuallu in am, bot once at hs.     Review of Systems Denies loc    Objective:   Physical Exam elderly, frail, no distress.  She is in a wheelchair. SKIN: Insulin injection sites at the anterior abdomen are normal    A1c=7.2   Assessment & Plan:  Dm.  overcontrolled in view of her advanced age, poor overall health, and high insulin requirement

## 2011-02-28 NOTE — Patient Instructions (Addendum)
reduce levemir to 55 unit units at bedtime reduce humalog to (just before each meal) 45-33-100 units take 5 extra units of humalog qac and at bedtime, as needed for any cbg over 250. please divide the supper insulin dose into 2 separate injections.   return 3 months. please call sooner for any cbg below 80, and tell us what time of day. for any cbg less than 100, give 1/2 the scheduled dose.

## 2011-03-03 ENCOUNTER — Encounter: Payer: Self-pay | Admitting: Endocrinology

## 2011-03-25 NOTE — Consult Note (Signed)
[facility]                          ENDOCRINOLOGY CONSULTATION   Lisa Walls, Lisa Walls                        MRN:          [id number]  DATE:05/21/2007                            DOB:          10-Mar-1923    REASON FOR VISIT:  The reason for this visit is for follow up of  diabetes.   HISTORY:  This is an 77 year old woman who states her glucose has been  well-controlled, except that it hit 300 for a few days last week when  she was ill.  In general she states her glucoses are in the low one-  hundreds, except being slightly higher at bedtime.   PAST MEDICAL HISTORY:  1. Dementia.  2. Diverticulosis.  3. UTIs.  4. Depression.  5. Hemorrhagic CVA.  6. Hypertension.   REVIEW OF SYSTEMS:  Denies hypoglycemia.   PHYSICAL EXAMINATION:  VITAL SIGNS:  Blood pressure 142/65, heart rate  66 and temperature 97.0.  GENERAL APPEARANCE:  In general the patient is in no distress.  EXTREMITIES:  Feet; she has a very slight patchy rash.  The feet are  otherwise of normal color and temperature, and there is no ulcer.  Dorsalis pedis pulses are intact bilaterally and sensation is intact to  touch.   LABORATORY STUDIES:  I have reviewed 50 home glucose readings.  Her  recent hemoglobin A-1-C was 7.5.   IMPRESSION:  Diabetes.  She needs a slight adjustment in her insulin.   PLAN:  1. Decrease Levemir to 35 units at bedtime.  2. Increase q.a.c. Humalog to 20 at breakfast, 10 at lunch and 50 at      supper.  3. Return in about three months.     Sean A. Everardo All, MD  Electronically Signed    SAE/MedQ  DD: 05/26/2007  DT: 05/27/2007  Job #: [id number]   cc:   Royanne Foots

## 2011-03-25 NOTE — Consult Note (Signed)
[facility]                          ENDOCRINOLOGY CONSULTATION   FANTASIA, JINKINS                        MRN:          [id number]  DATE:05/21/2007                            DOB:          April 12, 1923    REASON FOR VISIT:  Followup diabetes.   HISTORY OF PRESENT ILLNESS:  An 77 year old woman who states her  glucoses have been well controlled recently except she had an episode   Dictation ended at this point.     Sean A. Everardo All, MD  Electronically Signed    SAE/MedQ  DD: 05/23/2007  DT: 05/24/2007  Job #: [id number]

## 2011-03-28 NOTE — Consult Note (Signed)
[facility]                          ENDOCRINOLOGY CONSULTATION   Lisa Walls, Lisa Walls                        MRN:          [id number]  DATE:01/25/2007                            DOB:          1923-09-04    REASON FOR VISIT:  Follow up diabetes.   HISTORY OF PRESENT ILLNESS:  An 77 year old woman accompanied by her  son.  Her glucose is highest at h.s. when it is the 200s.  It is lowest  in the afternoon.  It is also about 200 in the morning.   PAST MEDICAL HISTORY:  Same as January 11, 2007.   REVIEW OF SYSTEMS:  Denies hypoglycemia.   PHYSICAL EXAMINATION:  VITAL SIGNS:  Blood pressure 147/65, heart rate  64, temperature is 97.2.  GENERAL:  In no distress.  EXTREMITIES:  No edema.   IMPRESSION:  She needs further adjustments with her diabetes.   PLAN:  1. Increase Levemir to 45 units q.h.s.  2. Increase q.a.c. Humalog to 20 breakfast, 10 lunch, and 30 supper.  3. Return in two to four weeks.  4. I told the patient and her son that due to the complexity of this      situation, we are going to have to take it in stages.     Sean A. Everardo All, MD  Electronically Signed    SAE/MedQ  DD: 01/28/2007  DT: 01/28/2007  Job #: [id number]   cc:   Royanne Foots

## 2011-03-28 NOTE — Consult Note (Signed)
[facility]                          ENDOCRINOLOGY CONSULTATION   Lisa Walls, Lisa Walls                        MRN:          [id number]  DATE:01/11/2007                            DOB:          [birth year]    The patient is self-referred.   REASON FOR REFERRAL:  Diabetes.   HISTORY OF PRESENT ILLNESS:  An 77 year old woman with a 14-year history  of diabetes, complicated by a CVA.  Family states she has been on  insulin the whole time.  She currently takes Levemir 50 units twice  daily and p.r.n. Humalog, averaging 10-25 units total per day.  She was  previously on Avandia but this was discontinued a year and a half ago  due to weight gain.  She has since re-lost the weight.  She describes  her diet as good.  She is able to get little exercise as she is in a  wheelchair.  Symptomatically, she has several years of forgetfulness but  no associated numbness of her feet.  Her glucoses vary between 95 and  300, generally higher later in the day.   PAST MEDICAL HISTORY:  1. Hemorrhagic CVA, 1999.  2. Depression.  3. Urinary tract infections.  4. Diverticulosis.   SOCIAL HISTORY:  She lives in a nursing home.  She is here today with  her two sons.   FAMILY HISTORY:  Positive for diabetes in four of her seven children.   REVIEW OF SYSTEMS:  Denies chest pain or loss of consciousness.  Denies  hypoglycemia.   PHYSICAL EXAMINATION:  VITAL SIGNS:  Blood pressure 129/68, heart rate  67, temperature is 97.6, the weight is 183.  GENERAL:  Elderly woman in a wheelchair, no distress.  SKIN:  Not diaphoretic.  She has a patchy rash across her anterior  tibial areas.  HEENT:  Pharynx is normal.  NECK:  No goiter.  CHEST:  Clear to auscultation.  No respiratory distress.  CARDIOVASCULAR:  No JVD, no edema.  Regular rate and rhythm, no murmur.  Pedal pulses are intact.  There is no bruit at the carotid arteries.  EXTREMITIES:  Feet normal color and  temperature.  There is no ulcer  present on the feet.  NEUROLOGIC:  Alert and oriented, and sensation is intact to touch on the  feet.   LABORATORY DATA:  I have reviewed 50 home glucose readings brought by  the patient.   IMPRESSION:  1. Diabetes of uncertain type.  The pattern of her glucoses appears to      indicate she needs more Humalog and less Levemir.  2. Slight rash on her lower extremities.  3. Other chronic medical problems as noted above.  She is at risk for      more complications.   PLAN:  1. We discussed the risk of diabetes.  2. Change Humalog to 10 mg t.i.d. (q.a.c.).  3. Decrease Levemir to 35 units twice a day.  4. Change p.r.n. Humalog to just 5 units p.r.n. glucose above 250.  5. Return in a few weeks.     Sean A. Everardo All, MD  Electronically  Signed    SAE/MedQ  DD: 01/12/2007  DT: 01/13/2007  Job #: [id number]   cc:   Royanne Foots, M.D.

## 2011-05-30 ENCOUNTER — Ambulatory Visit: Payer: Medicaid Other | Admitting: Endocrinology

## 2011-06-06 ENCOUNTER — Encounter: Payer: Self-pay | Admitting: Endocrinology

## 2011-06-06 ENCOUNTER — Ambulatory Visit (INDEPENDENT_AMBULATORY_CARE_PROVIDER_SITE_OTHER): Payer: PRIVATE HEALTH INSURANCE | Admitting: Endocrinology

## 2011-06-06 DIAGNOSIS — E109 Type 1 diabetes mellitus without complications: Secondary | ICD-10-CM

## 2011-06-06 NOTE — Patient Instructions (Addendum)
Please continue the same insulin. Please make a follow-up appointment in 3 months.

## 2011-06-06 NOTE — Progress Notes (Signed)
Subjective:    Patient ID: Lisa Walls, female    DOB: 26-Jun-1923, 77 y.o.   MRN: [id number]  HPI she brings a record of her cbg's which i have reviewed today.  It varies from 88-300.  There is no trend throughout the day.  pt states she feels well in general. Past Medical History  Diagnosis Date  . Depression   . Diabetes mellitus     Type I  . Hypertension   . Dementia   . Diverticulosis of colon   . History of CVA (cerebrovascular accident)   . PSORIASIS 11/29/2010    No past surgical history on file.  History   Social History  . Marital Status: Widowed    Spouse Name: N/A    Number of Children: N/A  . Years of Education: N/A   Occupational History  . Not on file.   Social History Main Topics  . Smoking status: Former Games developer  . Smokeless tobacco: Not on file  . Alcohol Use: Not on file  . Drug Use: Not on file  . Sexually Active: Not on file   Other Topics Concern  . Not on file   Social History Narrative   Several sons have DM    Current Outpatient Prescriptions on File Prior to Visit  Medication Sig Dispense Refill  . aspirin 81 MG tablet Take 81 mg by mouth daily.        Marland Kitchen atenolol (TENORMIN) 50 MG tablet Take 50 mg by mouth daily.        . benazepril (LOTENSIN) 20 MG tablet Take 20 mg by mouth daily.        . diphenhydrAMINE (BENADRYL) 25 mg capsule Take 25 mg by mouth every 6 (six) hours as needed.        . diphenhydramine-acetaminophen (TYLENOL PM) 25-500 MG TABS Take 1 tablet by mouth at bedtime.        . docusate sodium (COLACE) 100 MG capsule Take 100 mg by mouth at bedtime.        . furosemide (LASIX) 20 MG tablet Take 20 mg by mouth 2 (two) times daily.        . insulin detemir (LEVEMIR) 100 UNIT/ML injection Inject 70 Units into the skin at bedtime.        . insulin lispro (HUMALOG) 100 UNIT/ML injection Inject into the skin. Three times daily 45-33-105 units       . Lactobacillus (ACIDOPHILUS) CAPS Take 1 capsule by mouth.        . loratadine  (CLARITIN) 10 MG tablet Take 10 mg by mouth daily.        Marland Kitchen LORazepam (ATIVAN) 0.5 MG tablet Take 0.5 mg by mouth every 4 (four) hours as needed.        Marland Kitchen PARoxetine (PAXIL) 20 MG tablet Take 20 mg by mouth every morning.        . Skin Protectants, Misc. (EUCERIN) cream Apply topically as needed.        Marland Kitchen tenormin (ATENOLOL) 12.5 mg TABS Take 12.5 mg by mouth at bedtime.          No Known Allergies  Family History  Problem Relation Age of Onset  . Diabetes Son     BP 106/56  Pulse 66  Temp(Src) 98.8 F (37.1 C) (Oral)  SpO2 95%  Review of Systems denies hypoglycemia    Objective:   Physical Exam elderly, frail, no distress.  In wheelchair Pulses: dorsalis pedis intact bilat.   Feet: no  deformity.  no ulcer on the feet.  feet are of normal color and temp.  no edema Neuro: sensation is intact to touch on the feet  outside test results are reviewed: A1c=7.5    Assessment & Plan:  Dm, good control for this clinical situation.

## 2011-06-06 NOTE — Progress Notes (Signed)
  Subjective:    Patient ID: Lisa Walls, female    DOB: 11/13/1922, 77 y.o.   MRN: [id number]  HPI    Review of Systems     Objective:   Physical Exam  General:  obese.  no distress.  in wheelchair. Pulses:  dorsalis pedis intact bilat.   Extremities:  no deformity.  no ulcer on the feet.  feet are of normal color and temp.  1+ right pedal edema and trace left pedal edema.  there is a patchy rash on the feet Neurologic:  sensation is intact to touch on the feet, but decreased from normal on the right.      Assessment & Plan:   No problem-specific assessment & plan notes found for this encounter.

## 2011-08-29 ENCOUNTER — Encounter: Payer: Self-pay | Admitting: Endocrinology

## 2011-08-29 ENCOUNTER — Ambulatory Visit (INDEPENDENT_AMBULATORY_CARE_PROVIDER_SITE_OTHER): Payer: PRIVATE HEALTH INSURANCE | Admitting: Endocrinology

## 2011-08-29 DIAGNOSIS — E109 Type 1 diabetes mellitus without complications: Secondary | ICD-10-CM

## 2011-08-29 NOTE — Progress Notes (Signed)
  Subjective:    Patient ID: Lisa Walls, female    DOB: 03-19-1923, 77 y.o.   MRN: [id number]  HPI pt states she feels well in general, except for recurrent uti's.  she brings a record of her cbg's which i have reviewed today.  It varies from 90-300, but most are in the 100's.  There is no trend throughout the day. Past Medical History  Diagnosis Date  . Depression   . Diabetes mellitus     Type I  . Hypertension   . Dementia   . Diverticulosis of colon   . History of CVA (cerebrovascular accident)   . PSORIASIS 11/29/2010    No past surgical history on file.  History   Social History  . Marital Status: Widowed    Spouse Name: N/A    Number of Children: N/A  . Years of Education: N/A   Occupational History  . Not on file.   Social History Main Topics  . Smoking status: Former Games developer  . Smokeless tobacco: Not on file  . Alcohol Use: Not on file  . Drug Use: Not on file  . Sexually Active: Not on file   Other Topics Concern  . Not on file   Social History Narrative   Several sons have DM    Current Outpatient Prescriptions on File Prior to Visit  Medication Sig Dispense Refill  . aspirin 81 MG tablet Take 81 mg by mouth daily.        Marland Kitchen atenolol (TENORMIN) 50 MG tablet Take 50 mg by mouth daily.        . benazepril (LOTENSIN) 20 MG tablet Take 20 mg by mouth daily.        Marland Kitchen docusate sodium (COLACE) 100 MG capsule Take 100 mg by mouth at bedtime.        . furosemide (LASIX) 20 MG tablet Take 20 mg by mouth 2 (two) times daily.        . insulin lispro (HUMALOG) 100 UNIT/ML injection Inject into the skin. Three times daily 45-33-105 units 55 units at bedtime      . loratadine (CLARITIN) 10 MG tablet Take 10 mg by mouth daily.        Marland Kitchen PARoxetine (PAXIL) 20 MG tablet Take 20 mg by mouth every morning.        . Skin Protectants, Misc. (EUCERIN) cream Apply topically as needed.        Marland Kitchen tenormin (ATENOLOL) 12.5 mg TABS Take 12.5 mg by mouth at bedtime.        . insulin  detemir (LEVEMIR) 100 UNIT/ML injection Inject 70 Units into the skin at bedtime.          No Known Allergies  Family History  Problem Relation Age of Onset  . Diabetes Son     BP 114/60  Pulse 68  Temp(Src) 98.1 F (36.7 C) (Oral)  SpO2 95%    Review of Systems denies hypoglycemia.    Objective:   Physical Exam Vital signs: see vs page Gen: elderly, frail, no distress.  In wheelchair SKIN:  Insulin injection sites at the anterior abdomen are normal   outside test results are reviewed: A1c=7.7%    Assessment & Plan:  Dm.  this is the best control this pt should aim for, given her variable cbg's.

## 2011-08-29 NOTE — Patient Instructions (Addendum)
Please continue the same insulin. Please make a follow-up appointment in 4 months.  check your blood sugar 4 times a day--before the 3 meals, and at bedtime.  also check if you have symptoms of your blood sugar being too high or too low.  please keep a record of the readings and bring it to your next appointment here.  please call us sooner if you are having low blood sugar episodes, or if it stays over 200. good diet and exercise habits significanly improve the control of your diabetes.  please let me know if you wish to be referred to a dietician.  high blood sugar is very risky to your health.  you should see an eye doctor every year. controlling your blood pressure and cholesterol drastically reduces the damage diabetes does to your body.  this also applies to quitting smoking.  please discuss these with your doctor.  you should take an aspirin every day, unless you have been advised by a doctor not to.

## 2011-09-26 ENCOUNTER — Telehealth: Payer: Self-pay

## 2011-09-26 NOTE — Telephone Encounter (Signed)
Pt's living facility called stating pt's blood sugar is 72 and they are to call office for reading below 80. Please advise

## 2011-09-26 NOTE — Telephone Encounter (Signed)
Was this before lunch, or after

## 2011-09-29 NOTE — Telephone Encounter (Signed)
Left message for facility to callback office

## 2011-10-01 NOTE — Telephone Encounter (Signed)
Left message for facility to callback office 

## 2011-10-07 NOTE — Telephone Encounter (Signed)
Unable to reach anyone at the facility by phone, faxing phone note to facility and closing phone note.

## 2012-01-01 ENCOUNTER — Telehealth: Payer: Self-pay | Admitting: *Deleted

## 2012-01-01 MED ORDER — INSULIN ASPART 100 UNIT/ML ~~LOC~~ SOLN: SUBCUTANEOUS | Status: DC

## 2012-01-01 NOTE — Telephone Encounter (Signed)
Insurance company sent pt a letter stating that Humalog is no longer on their formulary and will not be covered without PA after this month. Formulary alternatives are Novolog, Novolin, Levemir and Lantus.

## 2012-01-01 NOTE — Telephone Encounter (Signed)
Rx faxed to facility. 

## 2012-01-01 NOTE — Telephone Encounter (Signed)
Please change to novolog--same dosage

## 2012-01-01 NOTE — Telephone Encounter (Addendum)
Rx changed to Novolog-rx printed to fax and sent to Pt's Nursing Home facility.

## 2012-01-02 ENCOUNTER — Ambulatory Visit: Payer: PRIVATE HEALTH INSURANCE | Admitting: Endocrinology

## 2012-01-16 ENCOUNTER — Ambulatory Visit (INDEPENDENT_AMBULATORY_CARE_PROVIDER_SITE_OTHER): Payer: Medicaid Other | Admitting: Endocrinology

## 2012-01-16 ENCOUNTER — Encounter: Payer: Self-pay | Admitting: Endocrinology

## 2012-01-16 DIAGNOSIS — E109 Type 1 diabetes mellitus without complications: Secondary | ICD-10-CM

## 2012-01-16 NOTE — Patient Instructions (Addendum)
Reduce novolog to 3x a day (just before each meal) 45-25-80 units.    Same levemir. Please make a follow-up appointment in 4 months.  check your blood sugar 4 times a day--before the 3 meals, and at bedtime.  also check if you have symptoms of your blood sugar being too high or too low.  please keep a record of the readings and bring it to your next appointment here.  please call us sooner if you are having low blood sugar episodes, or if it stays over 200.

## 2012-01-16 NOTE — Progress Notes (Signed)
Subjective:    Patient ID: Lisa Walls, female    DOB: [birth year], 78 y.o.   MRN: [id number]  HPI Pt returns with husband for f/u of insulin-requiring DM (1988).  pt states she feels well in general, except for nasal congestion.  husband brings a record of her cbg's which i have reviewed today.  She had 2 episodes of mild hypoglycemia--both were in January, and both were mild.  However, she once had an episode of severe hypoglycemia after the evening meal, after taking 100 units.   Past Medical History  Diagnosis Date  . Depression   . Diabetes mellitus     Type I  . Hypertension   . Dementia   . Diverticulosis of colon   . History of CVA (cerebrovascular accident)   . PSORIASIS 11/29/2010    No past surgical history on file.  History   Social History  . Marital Status: Widowed    Spouse Name: N/A    Number of Children: N/A  . Years of Education: N/A   Occupational History  . Not on file.   Social History Main Topics  . Smoking status: Former Games developer  . Smokeless tobacco: Not on file  . Alcohol Use: Not on file  . Drug Use: Not on file  . Sexually Active: Not on file   Other Topics Concern  . Not on file   Social History Narrative   Several sons have DM    Current Outpatient Prescriptions on File Prior to Visit  Medication Sig Dispense Refill  . aspirin 81 MG tablet Take 81 mg by mouth daily.        Marland Kitchen atenolol (TENORMIN) 50 MG tablet Take 50 mg by mouth daily.        . benazepril (LOTENSIN) 20 MG tablet Take 20 mg by mouth daily.        Marland Kitchen docusate sodium (COLACE) 100 MG capsule Take 100 mg by mouth at bedtime.        . furosemide (LASIX) 20 MG tablet Take 20 mg by mouth 2 (two) times daily.        . insulin detemir (LEVEMIR) 100 UNIT/ML injection Inject 55 Units into the skin at bedtime.       Marland Kitchen loratadine (CLARITIN) 10 MG tablet Take 10 mg by mouth daily.        Marland Kitchen PARoxetine (PAXIL) 20 MG tablet Take 20 mg by mouth every morning.        . Skin Protectants,  Misc. (EUCERIN) cream Apply topically as needed.        Marland Kitchen tenormin (ATENOLOL) 12.5 mg TABS Take 12.5 mg by mouth at bedtime.          No Known Allergies  Family History  Problem Relation Age of Onset  . Diabetes Son     BP 102/52  Pulse 66  Temp(Src) 98 F (36.7 C) (Oral)  SpO2 96%    Review of Systems Pt has lost a few lbs.    Objective:   Physical Exam Vital signs: see vs page Gen: elderly, frail, no distress.  In wheelchair Pulses: dorsalis pedis intact bilat.   Feet: no deformity.  no ulcer on the feet.  feet are of normal color and temp.  no edema Neuro: sensation is intact to touch on the feet.   There are psoriatic plaques on the legs.    outside test results are reviewed: A1c=7.1    Assessment & Plan:  DM, overcontrolled for this clinical situation (advanced  age,  Poor overall health, inability to notify staff of sxs of hypoglycemia)

## 2012-02-02 ENCOUNTER — Telehealth: Payer: Self-pay

## 2012-02-02 NOTE — Telephone Encounter (Signed)
[facility] Triage Call Report Triage Record Num: [id number] Operator: Gypsy Decant Patient Name: Lisa Walls Call Date & Time: 01/31/2012 8:40:45PM Patient Phone: [phone] PCP: Romero Belling Patient Gender: Female PCP Fax : [phone] Patient DOB: 09-06-1923 Practice Name: [facility] Reason for Call: Caller: Bonita Quin LPN; PCP: Romero Belling; CB#: [phone]; Call regarding Low BS 74 this morning, did fine the rest of the day, does not need a call back, they are suppose to leave a message for MD; Call to Facility and spoke with Daphne/ RN, who states that Lunch BS 223 and Dinner was 223. Advised RN that will leave note in chart. She will follow protocol for sliding scale. Health Hx was complete. Reports pt has no s/sx at this time and is doing fine. Protocol(s) Used: Office Note Recommended Outcome per Protocol: Information Noted and Sent to Office Reason for Outcome: Caller information to office Care Advice: ~ 01/31/2012 8:50:53PM Page 1 of 1 [id number]

## 2012-02-02 NOTE — Telephone Encounter (Signed)
Reduce levemir to 50 units qhs

## 2012-02-02 NOTE — Telephone Encounter (Signed)
Lisa Walls at [facility] informed of MD's advisement for pt to reduce Levemir.

## 2012-06-04 ENCOUNTER — Encounter: Payer: Self-pay | Admitting: Endocrinology

## 2012-06-04 ENCOUNTER — Ambulatory Visit (INDEPENDENT_AMBULATORY_CARE_PROVIDER_SITE_OTHER): Payer: Medicare Other | Admitting: Endocrinology

## 2012-06-04 VITALS — BP 104/52 | HR 70 | Temp 98.4°F

## 2012-06-04 DIAGNOSIS — E109 Type 1 diabetes mellitus without complications: Secondary | ICD-10-CM

## 2012-06-04 NOTE — Progress Notes (Signed)
  Subjective:    Patient ID: Lisa Walls, female    DOB: 1923-04-13, 78 y.o.   MRN: [id number]  HPI Pt returns with husband for f/u of insulin-requiring DM (dx'ed 1988; complicated by CVA).  pt states she feels well in general, except for ongoing poor memory.  husband brings a record of her cbg's which i have reviewed today.  She had 2 episodes of mild hypoglycemia--both were in may, and both were mild--before breakfast.  cbg's are mostly in the 100's.  It is lowest in am and in the afternoon.   Past Medical History  Diagnosis Date  . Depression   . Diabetes mellitus     Type I  . Hypertension   . Dementia   . Diverticulosis of colon   . History of CVA (cerebrovascular accident)   . PSORIASIS 11/29/2010    No past surgical history on file.  History   Social History  . Marital Status: Widowed    Spouse Name: N/A    Number of Children: N/A  . Years of Education: N/A   Occupational History  . Not on file.   Social History Main Topics  . Smoking status: Former Games developer  . Smokeless tobacco: Not on file  . Alcohol Use: Not on file  . Drug Use: Not on file  . Sexually Active: Not on file   Other Topics Concern  . Not on file   Social History Narrative   Several sons have DM    Current Outpatient Prescriptions on File Prior to Visit  Medication Sig Dispense Refill  . aspirin 81 MG tablet Take 81 mg by mouth daily.        Marland Kitchen atenolol (TENORMIN) 50 MG tablet Take 50 mg by mouth daily.        . benazepril (LOTENSIN) 20 MG tablet Take 20 mg by mouth daily.        Marland Kitchen docusate sodium (COLACE) 100 MG capsule Take 100 mg by mouth at bedtime.        . furosemide (LASIX) 20 MG tablet Take 20 mg by mouth 2 (two) times daily.        . insulin aspart (NOVOLOG) 100 UNIT/ML injection Inject subcutaneously 3 times daily (just before each meal) 45-25-80 units       . insulin detemir (LEVEMIR) 100 UNIT/ML injection Inject 45 Units into the skin at bedtime.   10 mL    . loratadine (CLARITIN)  10 MG tablet Take 10 mg by mouth daily.        . Skin Protectants, Misc. (EUCERIN) cream Apply topically as needed.        Marland Kitchen tenormin (ATENOLOL) 12.5 mg TABS Take 12.5 mg by mouth at bedtime.        Marland Kitchen DISCONTD: insulin lispro (HUMALOG) 100 UNIT/ML injection Inject into the skin. Three times daily 45-33-105 units        No Known Allergies  Family History  Problem Relation Age of Onset  . Diabetes Son     BP 104/52  Pulse 70  Temp 98.4 F (36.9 C) (Oral)  SpO2 94%    Review of Systems Denies LOC    Objective:   Physical Exam VITAL SIGNS:  See vs page GENERAL: no distress SKIN:  Insulin injection sites at the anterior abdomen are normal, except for a few ecchymoses.   outside test results are reviewed: A1c=7.4    Assessment & Plan:  DM is slightly overcontrolled.

## 2012-06-04 NOTE — Patient Instructions (Addendum)
Please continue the same novolog (3x a day (just before each meal) 45-25-80 units).    reduce levemir to 45 units at bedtime. Please make a follow-up appointment in 4 months.  check your blood sugar 4 times a day--before the 3 meals, and at bedtime.  also check if you have symptoms of your blood sugar being too high or too low.  please keep a record of the readings and bring it to your next appointment here.  please call us sooner if you are having low blood sugar episodes, or if it stays over 200.

## 2012-09-24 ENCOUNTER — Ambulatory Visit (INDEPENDENT_AMBULATORY_CARE_PROVIDER_SITE_OTHER): Payer: Medicare Other | Admitting: Endocrinology

## 2012-09-24 VITALS — BP 122/70 | HR 77 | Temp 98.2°F | Wt 213.0 lb

## 2012-09-24 DIAGNOSIS — E109 Type 1 diabetes mellitus without complications: Secondary | ICD-10-CM

## 2012-09-24 NOTE — Progress Notes (Signed)
Subjective:    Patient ID: Lisa Walls, female    DOB: [birth year], 78 y.o.   MRN: [id number]  HPI Pt returns with husband for f/u of insulin-requiring DM (dx'ed 1988; complicated by CVA).  husb states pt feels well in general, except for ongoing poor memory.  husband brings a record of her cbg's which i have reviewed today.  She had 2 episodes of mild hypoglycemia--both were in the early hrs of the AM.  cbg's are mostly in the 100's.  It is well-controlled at other times of day.   Past Medical History  Diagnosis Date  . Depression   . Diabetes mellitus     Type I  . Hypertension   . Dementia   . Diverticulosis of colon   . History of CVA (cerebrovascular accident)   . PSORIASIS 11/29/2010    No past surgical history on file.  History   Social History  . Marital Status: Widowed    Spouse Name: N/A    Number of Children: N/A  . Years of Education: N/A   Occupational History  . Not on file.   Social History Main Topics  . Smoking status: Former Games developer  . Smokeless tobacco: Not on file  . Alcohol Use: Not on file  . Drug Use: Not on file  . Sexually Active: Not on file   Other Topics Concern  . Not on file   Social History Narrative   Several sons have DM    Current Outpatient Prescriptions on File Prior to Visit  Medication Sig Dispense Refill  . aspirin 81 MG tablet Take 81 mg by mouth daily.        Marland Kitchen atenolol (TENORMIN) 50 MG tablet Take 50 mg by mouth daily.        . benazepril (LOTENSIN) 20 MG tablet Take 20 mg by mouth daily.        Marland Kitchen docusate sodium (COLACE) 100 MG capsule Take 100 mg by mouth at bedtime.        . furosemide (LASIX) 20 MG tablet Take 20 mg by mouth 2 (two) times daily.        . insulin aspart (NOVOLOG) 100 UNIT/ML injection Inject subcutaneously 3 times daily (just before each meal) 45-25-80 units       . insulin detemir (LEVEMIR) 100 UNIT/ML injection Inject 40 Units into the skin at bedtime.   10 mL    . loratadine (CLARITIN) 10 MG tablet  Take 10 mg by mouth daily.        Marland Kitchen PARoxetine (PAXIL) 30 MG tablet Take 30 mg by mouth daily.      . Skin Protectants, Misc. (EUCERIN) cream Apply topically as needed.        Marland Kitchen tenormin (ATENOLOL) 12.5 mg TABS Take 12.5 mg by mouth at bedtime.        . [DISCONTINUED] insulin lispro (HUMALOG) 100 UNIT/ML injection Inject into the skin. Three times daily 45-33-105 units        No Known Allergies  Family History  Problem Relation Age of Onset  . Diabetes Son     BP 122/70  Pulse 77  Temp 98.2 F (36.8 C) (Oral)  Wt 213 lb (96.616 kg)  SpO2 97%    Review of Systems Denies LOC    Objective:   Physical Exam Vital signs: see vs page Gen: elderly, frail, no distress.  In wheelchair Pulses: dorsalis pedis intact bilat.   Feet: no deformity.  no ulcer on the feet.  feet  are of normal color and temp.  no edema Neuro: sensation is intact to touch on the feet.   There are psoriatic plaques on the legs.   outside test results are reviewed: A1c=6.9    Assessment & Plan:  DM, slightly overcontrolled

## 2012-09-24 NOTE — Patient Instructions (Addendum)
Please continue the same novolog (3x a day (just before each meal) 45-25-80 units).    reduce levemir to 40 units at bedtime. Please make a follow-up appointment in 4 months.  check your blood sugar 4 times a day--before the 3 meals, and at bedtime.  also check if you have symptoms of your blood sugar being too high or too low.  please keep a record of the readings and bring it to your next appointment here.  please call us sooner if you are having low blood sugar episodes, or if it stays over 200.

## 2013-01-26 ENCOUNTER — Ambulatory Visit: Payer: Medicare Other | Admitting: Endocrinology

## 2013-02-08 ENCOUNTER — Ambulatory Visit (INDEPENDENT_AMBULATORY_CARE_PROVIDER_SITE_OTHER): Payer: Medicare Other | Admitting: Endocrinology

## 2013-02-08 VITALS — BP 128/74 | HR 78 | Wt 185.0 lb

## 2013-02-08 DIAGNOSIS — E109 Type 1 diabetes mellitus without complications: Secondary | ICD-10-CM

## 2013-02-08 NOTE — Patient Instructions (Addendum)
Please continue the same novolog (3x a day (just before each meal) 40-20-75 units).  reduce levemir to 30 units at bedtime.  Please make a follow-up appointment in 4 months.  check your blood sugar 4 times a day--before the 3 meals, and at bedtime.  also check if you have symptoms of your blood sugar being too high or too low.  please keep a record of the readings and bring it to your next appointment here.  please call us sooner if you are having low blood sugar episodes, or if it stays over 200.

## 2013-02-08 NOTE — Progress Notes (Signed)
Subjective:    Patient ID: Lisa Walls, female    DOB: [birth year], 79 y.o.   MRN: [id number]  HPI Pt returns with husband for f/u of insulin-requiring DM (dx'ed 1988; complicated by CVA; she lives at [facility] [place]; son brings pt; he lives in [place], and spends 1 week a month here; her last episode of severe hypoglycemia was December, 2013; she has never had DKA).  husb states pt feels well in general, except for ongoing poor memory.  husband brings a record of her cbg's which i have reviewed today.  It varies from 63-200, but most are in the 100's.  Aside from being low in the early am, there is no trend throughout the day.  When they happen during the day, the lower cbg's are usually after a smaller-than-expected meal.  husband says she is also having hypoglycemia in the middle of the night, but the is no such notation in record sent by facility.   Past Medical History  Diagnosis Date  . Depression   . Diabetes mellitus     Type I  . Hypertension   . Dementia   . Diverticulosis of colon   . History of CVA (cerebrovascular accident)   . PSORIASIS 11/29/2010    No past surgical history on file.  History   Social History  . Marital Status: Widowed    Spouse Name: N/A    Number of Children: N/A  . Years of Education: N/A   Occupational History  . Not on file.   Social History Main Topics  . Smoking status: Former Games developer  . Smokeless tobacco: Not on file  . Alcohol Use: Not on file  . Drug Use: Not on file  . Sexually Active: Not on file   Other Topics Concern  . Not on file   Social History Narrative   Several sons have DM    Current Outpatient Prescriptions on File Prior to Visit  Medication Sig Dispense Refill  . aspirin 81 MG tablet Take 81 mg by mouth daily.        Marland Kitchen atenolol (TENORMIN) 50 MG tablet Take 50 mg by mouth daily.        . benazepril (LOTENSIN) 20 MG tablet Take 20 mg by mouth daily.        Marland Kitchen docusate sodium (COLACE) 100 MG capsule Take 100  mg by mouth at bedtime.        . furosemide (LASIX) 20 MG tablet Take 20 mg by mouth 2 (two) times daily.        . insulin aspart (NOVOLOG) 100 UNIT/ML injection Inject subcutaneously 3 times daily (just before each meal) 40-20-75 units      . insulin detemir (LEVEMIR) 100 UNIT/ML injection Inject 30 Units into the skin at bedtime.   10 mL    . loratadine (CLARITIN) 10 MG tablet Take 10 mg by mouth daily.        Marland Kitchen PARoxetine (PAXIL) 30 MG tablet Take 30 mg by mouth daily.      . Skin Protectants, Misc. (EUCERIN) cream Apply topically as needed.        Marland Kitchen tenormin (ATENOLOL) 12.5 mg TABS Take 12.5 mg by mouth at bedtime.        . [DISCONTINUED] insulin lispro (HUMALOG) 100 UNIT/ML injection Inject into the skin. Three times daily 45-33-105 units       No current facility-administered medications on file prior to visit.    No Known Allergies  Family History  Problem  Relation Age of Onset  . Diabetes Son     BP 128/74  Pulse 78  Wt 185 lb (83.915 kg)  BMI 32.78 kg/m2  SpO2 98%   Review of Systems Denies LOC.  She has lost a few lbs.      Objective:   Physical Exam VITAL SIGNS:  See vs page GENERAL: no distress SKIN:  Insulin injection sites at the anterior abdomen are normal.     outside test results are reviewed: A1c=7.1    Assessment & Plan:  DM: overcontrolled

## 2013-06-21 ENCOUNTER — Ambulatory Visit: Payer: Medicare Other | Admitting: Endocrinology

## 2013-06-28 ENCOUNTER — Ambulatory Visit (INDEPENDENT_AMBULATORY_CARE_PROVIDER_SITE_OTHER): Payer: Medicare Other | Admitting: Endocrinology

## 2013-06-28 ENCOUNTER — Encounter: Payer: Self-pay | Admitting: Endocrinology

## 2013-06-28 VITALS — BP 132/70 | HR 73 | Wt 180.0 lb

## 2013-06-28 DIAGNOSIS — E109 Type 1 diabetes mellitus without complications: Secondary | ICD-10-CM

## 2013-06-28 NOTE — Patient Instructions (Addendum)
Please reduce the novolog to (3x a day (just before each meal) 40-15-75 units).  Please make a follow-up appointment in 4 months.  check your blood sugar 4 times a day--before the 3 meals, and at bedtime.  also check if you have symptoms of your blood sugar being too high or too low.  please keep a record of the readings and bring it to your next appointment here.  please call us sooner if you are having low blood sugar episodes, or if it stays over 200.

## 2013-06-28 NOTE — Progress Notes (Signed)
Subjective:    Patient ID: Lisa Walls, female    DOB: 1923/06/18, 79 y.o.   MRN: [id number]  HPI Pt returns with husband for f/u of insulin-requiring DM (dx'ed 1988; she has mild if any neuropathy of the lower extremities, but she has associated CVA; she lives at [facility] [place]; son brings pt; he lives in [place], and spends 1 week a month here; her last episode of severe hypoglycemia was December, 2013; she has never had DKA).  son brings a record of his cbg's which i have reviewed today.  She can have a cbg below 100 at any time of day.  It is in general lowest before the evening meal (65, x 1).  Past Medical History  Diagnosis Date  . Depression   . Diabetes mellitus     Type I  . Hypertension   . Dementia   . Diverticulosis of colon   . History of CVA (cerebrovascular accident)   . PSORIASIS 11/29/2010    No past surgical history on file.  History   Social History  . Marital Status: Widowed    Spouse Name: N/A    Number of Children: N/A  . Years of Education: N/A   Occupational History  . Not on file.   Social History Main Topics  . Smoking status: Former Games developer  . Smokeless tobacco: Not on file  . Alcohol Use: Not on file  . Drug Use: Not on file  . Sexual Activity: Not on file   Other Topics Concern  . Not on file   Social History Narrative   Several sons have DM    Current Outpatient Prescriptions on File Prior to Visit  Medication Sig Dispense Refill  . aspirin 81 MG tablet Take 81 mg by mouth daily.        Marland Kitchen atenolol (TENORMIN) 50 MG tablet Take 50 mg by mouth daily.        . benazepril (LOTENSIN) 20 MG tablet Take 20 mg by mouth daily.        Marland Kitchen docusate sodium (COLACE) 100 MG capsule Take 100 mg by mouth at bedtime.        . furosemide (LASIX) 20 MG tablet Take 20 mg by mouth 2 (two) times daily.        . insulin aspart (NOVOLOG) 100 UNIT/ML injection Inject subcutaneously 3 times daily (just before each meal) 40-15-75 units      . insulin  detemir (LEVEMIR) 100 UNIT/ML injection Inject 30 Units into the skin at bedtime.   10 mL    . loratadine (CLARITIN) 10 MG tablet Take 10 mg by mouth daily.        Marland Kitchen PARoxetine (PAXIL) 30 MG tablet Take 30 mg by mouth daily.      . Skin Protectants, Misc. (EUCERIN) cream Apply topically as needed.        Marland Kitchen tenormin (ATENOLOL) 12.5 mg TABS Take 12.5 mg by mouth at bedtime.        . [DISCONTINUED] insulin lispro (HUMALOG) 100 UNIT/ML injection Inject into the skin. Three times daily 45-33-105 units       No current facility-administered medications on file prior to visit.    No Known Allergies  Family History  Problem Relation Age of Onset  . Diabetes Son     BP 132/70  Pulse 73  Wt 180 lb (81.647 kg)  BMI 31.89 kg/m2  SpO2 97%  Review of Systems Denies LOC and weight change    Objective:  Physical Exam Vital signs: see vs page Gen: elderly, frail, no distress.  In wheelchair   outside test results are reviewed: A1c=7.5    Assessment & Plan:  DM: This insulin regimen was chosen from multiple options, as it best matches her insulin to her changing requirements throughout the day.  The benefits of glycemic control must be weighed against the risks of hypoglycemia.   Dementia: this causes high risk from any hypoglycemia, as she is unable to summon help.

## 2013-07-29 ENCOUNTER — Telehealth: Payer: Self-pay

## 2013-09-22 ENCOUNTER — Telehealth: Payer: Self-pay

## 2013-09-22 NOTE — Telephone Encounter (Signed)
The patient called and stated she wanted to discuss her blood sugars.  Pt's callback - [phone]   Thanks!

## 2013-09-22 NOTE — Telephone Encounter (Signed)
Attempted to call patient back.  No answer.

## 2013-10-18 ENCOUNTER — Encounter: Payer: Self-pay | Admitting: Endocrinology

## 2013-10-18 ENCOUNTER — Ambulatory Visit (INDEPENDENT_AMBULATORY_CARE_PROVIDER_SITE_OTHER): Payer: Medicare Other | Admitting: Endocrinology

## 2013-10-18 VITALS — BP 118/64 | HR 67 | Temp 98.2°F | Ht 63.0 in | Wt 180.0 lb

## 2013-10-18 DIAGNOSIS — E1059 Type 1 diabetes mellitus with other circulatory complications: Secondary | ICD-10-CM

## 2013-10-18 DIAGNOSIS — E109 Type 1 diabetes mellitus without complications: Secondary | ICD-10-CM

## 2013-10-18 DIAGNOSIS — E1051 Type 1 diabetes mellitus with diabetic peripheral angiopathy without gangrene: Secondary | ICD-10-CM | POA: Insufficient documentation

## 2013-10-18 NOTE — Progress Notes (Signed)
Subjective:    Patient ID: Lisa Walls, female    DOB: [birth year], 79 y.o.   MRN: [id number]  HPI Pt returns with husband for f/u of insulin-requiring DM (dx'ed 1988; she has mild if any neuropathy of the lower extremities, but she has associated CVA; she lives at [facility] [place]; son brings pt; he lives in [place], and spends 1 week a month here; her last episode of severe hypoglycemia was December, 2013; she has never had DKA).  son brings a record of his cbg's which i have reviewed today.  She has has hypoglycemia several times per month, usually at hs.  She has been discovered unresponsive with this several times. This happens with a skipped or smaller-than-expected meal.   Past Medical History  Diagnosis Date  . Depression   . Diabetes mellitus     Type I  . Hypertension   . Dementia   . Diverticulosis of colon   . History of CVA (cerebrovascular accident)   . PSORIASIS 11/29/2010    No past surgical history on file.  History   Social History  . Marital Status: Widowed    Spouse Name: N/A    Number of Children: N/A  . Years of Education: N/A   Occupational History  . Not on file.   Social History Main Topics  . Smoking status: Former Games developer  . Smokeless tobacco: Not on file  . Alcohol Use: Not on file  . Drug Use: Not on file  . Sexual Activity: Not on file   Other Topics Concern  . Not on file   Social History Narrative   Several sons have DM    Current Outpatient Prescriptions on File Prior to Visit  Medication Sig Dispense Refill  . aspirin 81 MG tablet Take 81 mg by mouth daily.        Marland Kitchen atenolol (TENORMIN) 50 MG tablet Take 50 mg by mouth daily.        . benazepril (LOTENSIN) 20 MG tablet Take 20 mg by mouth daily.        Marland Kitchen docusate sodium (COLACE) 100 MG capsule Take 100 mg by mouth at bedtime.        . furosemide (LASIX) 20 MG tablet Take 20 mg by mouth 2 (two) times daily.        . insulin aspart (NOVOLOG) 100 UNIT/ML injection Inject  subcutaneously 3 times daily (just before each meal) 40-15-50 units      . insulin detemir (LEVEMIR) 100 UNIT/ML injection Inject 20 Units into the skin at bedtime.   10 mL    . loratadine (CLARITIN) 10 MG tablet Take 10 mg by mouth daily.        Marland Kitchen PARoxetine (PAXIL) 30 MG tablet Take 30 mg by mouth daily.      . Skin Protectants, Misc. (EUCERIN) cream Apply topically as needed.        Marland Kitchen tenormin (ATENOLOL) 12.5 mg TABS Take 12.5 mg by mouth at bedtime.        . [DISCONTINUED] insulin lispro (HUMALOG) 100 UNIT/ML injection Inject into the skin. Three times daily 45-33-105 units       No current facility-administered medications on file prior to visit.   No Known Allergies  Family History  Problem Relation Age of Onset  . Diabetes Son    BP 118/64  Pulse 67  Temp(Src) 98.2 F (36.8 C) (Oral)  Ht 5\' 3"  (1.6 m)  Wt 180 lb (81.647 kg)  BMI 31.89 kg/m2  SpO2 97%  Review of Systems Denies weight change and n/v.    Objective:   Physical Exam VITAL SIGNS:  See vs page GENERAL: no distress  (sons bring a record of her cbg's which i have reviewed today).    Assessment & Plan:  DM: This insulin regimen was chosen from multiple options, as it best matches her insulin to her changing requirements throughout the day.  The benefits of glycemic control must be weighed against the risks of hypoglycemia.  i don't have a1c result. But i now favor decreasing insulin, due to recurrent hypoglycemia.   Dementia: this causes high risk from any hypoglycemia, as she is unable to summon help.

## 2013-10-18 NOTE — Patient Instructions (Addendum)
Please reduce the novolog to 3x a day (just after each meal) 40-15-50 units.  Please reduce the levemir to 20 units at bedtime.   If pt eats little or none, please skip that insulin dose.   Please note that taking the novolog after eating is a new order.   Please make a follow-up appointment in 3 months.   check resident's blood sugar 4 times a day--before the 3 meals, and at bedtime.  also check if resident has symptoms of your blood sugar being too high or too low.  please keep a record of the readings and bring it to resident's next appointment here.  please call [place] sooner if resident has hypoglycemia, or if it stays over 200.   Please send a1c result when available.

## 2013-12-23 NOTE — Telephone Encounter (Signed)
I'm assuming this was opened in error, last CMA who opened the encounter is no longer with [facility] 

## 2014-01-24 ENCOUNTER — Ambulatory Visit (INDEPENDENT_AMBULATORY_CARE_PROVIDER_SITE_OTHER): Payer: Medicare Other | Admitting: Endocrinology

## 2014-01-24 ENCOUNTER — Encounter: Payer: Self-pay | Admitting: Endocrinology

## 2014-01-24 VITALS — BP 112/80 | HR 70 | Temp 98.3°F

## 2014-01-24 DIAGNOSIS — E1059 Type 1 diabetes mellitus with other circulatory complications: Secondary | ICD-10-CM

## 2014-01-24 NOTE — Patient Instructions (Addendum)
Please reduce the lunch novolog to 10 units Please continue the same levemir.  If pt eats little or none, please skip that insulin dose.    Please make a follow-up appointment in 3 months.   check resident's blood sugar 4 times a day--before the 3 meals, and at bedtime.  also check if resident has symptoms of your blood sugar being too high or too low.  please keep a record of the readings and bring it to resident's next appointment here.  please call [place] sooner if resident has hypoglycemia, or if it stays over 200.

## 2014-01-24 NOTE — Progress Notes (Signed)
Subjective:    Patient ID: Lisa Walls, female    DOB: [birth year], 80 y.o.   MRN: [id number]  HPI Pt returns with husband for f/u of insulin-requiring DM (dx'ed 1988; she has mild if any neuropathy of the lower extremities, but she has associated CVA; she lives at [facility] [place]; son brings pt; he lives in [place], and spends 1 week a month here; her last episode of confirmed severe hypoglycemia was December, 2013, but several episodes in 2014 were suspected; these were believed to be due to missing meals after getting novolog;  This was addressed with changing the novolog to after eating; she has never had DKA).  Pt is here with her son today.  he brings a record of her cbg's which i have reviewed today.  It will be scanned into the record.   Past Medical History  Diagnosis Date  . Depression   . Diabetes mellitus     Type I  . Hypertension   . Dementia   . Diverticulosis of colon   . History of CVA (cerebrovascular accident)   . PSORIASIS 11/29/2010    No past surgical history on file.  History   Social History  . Marital Status: Widowed    Spouse Name: N/A    Number of Children: N/A  . Years of Education: N/A   Occupational History  . Not on file.   Social History Main Topics  . Smoking status: Former Games developer  . Smokeless tobacco: Not on file  . Alcohol Use: Not on file  . Drug Use: Not on file  . Sexual Activity: Not on file   Other Topics Concern  . Not on file   Social History Narrative   Several sons have DM    Current Outpatient Prescriptions on File Prior to Visit  Medication Sig Dispense Refill  . aspirin 81 MG tablet Take 81 mg by mouth daily.        Marland Kitchen atenolol (TENORMIN) 50 MG tablet Take 50 mg by mouth daily.        . benazepril (LOTENSIN) 20 MG tablet Take 20 mg by mouth daily.        . divalproex (DEPAKOTE) 125 MG DR tablet Take 125 mg by mouth 2 (two) times daily.      Marland Kitchen docusate sodium (COLACE) 100 MG capsule Take 100 mg by mouth at  bedtime.        . furosemide (LASIX) 20 MG tablet Take 20 mg by mouth 2 (two) times daily.        . insulin aspart (NOVOLOG) 100 UNIT/ML injection Inject subcutaneously 3 times daily (just before each meal) 40-10-50 units      . insulin detemir (LEVEMIR) 100 UNIT/ML injection Inject 20 Units into the skin at bedtime.   10 mL    . loratadine (CLARITIN) 10 MG tablet Take 10 mg by mouth daily.        Marland Kitchen PARoxetine (PAXIL) 30 MG tablet Take 30 mg by mouth daily.      . Skin Protectants, Misc. (EUCERIN) cream Apply topically as needed.        Marland Kitchen tenormin (ATENOLOL) 12.5 mg TABS Take 12.5 mg by mouth at bedtime.        . [DISCONTINUED] insulin lispro (HUMALOG) 100 UNIT/ML injection Inject into the skin. Three times daily 45-33-105 units       No current facility-administered medications on file prior to visit.   No Known Allergies  Family History  Problem Relation  Age of Onset  . Diabetes Son    BP 112/80  Pulse 70  Temp(Src) 98.3 F (36.8 C) (Oral)  SpO2 97%  Review of Systems Denies LOC.  Pt has lost a few lbs.     Objective:   Physical Exam Vital signs: see vs page Gen: elderly, frail, no distress.  In wheelchair.   outside test results are reviewed:  A1c=7.1    Assessment & Plan:  DM: This insulin regimen was chosen from multiple options, as it best matches her insulin to her changing requirements throughout the day.  The benefits of glycemic control must be weighed against the risks of hypoglycemia.  Hypoglycemia seems to be less often since novolog was changed from ac to pc.  Still, we should reduce insulin further. Dementia: this causes high risk from any hypoglycemia, as she is unable to summon help.

## 2014-04-14 ENCOUNTER — Encounter: Payer: Self-pay | Admitting: Endocrinology

## 2014-04-14 ENCOUNTER — Ambulatory Visit (INDEPENDENT_AMBULATORY_CARE_PROVIDER_SITE_OTHER): Payer: Medicare Other | Admitting: Endocrinology

## 2014-04-14 VITALS — BP 110/60 | HR 61 | Temp 97.8°F

## 2014-04-14 DIAGNOSIS — E1059 Type 1 diabetes mellitus with other circulatory complications: Secondary | ICD-10-CM

## 2014-04-14 NOTE — Patient Instructions (Addendum)
Please change the novolog to 3 times daily (just before each meal) 45-5-50 units Please continue the same levemir.  If resident eats little or none, please skip that insulin dose.    Please make a follow-up appointment in 3 months.   check resident's blood sugar 4 times a day--before the 3 meals, and at bedtime.  also check if resident has symptoms of your blood sugar being too high or too low.  please keep a record of the readings and bring it to resident's next appointment here.  please call [place] sooner if resident has hypoglycemia, or if it stays over 200.

## 2014-04-14 NOTE — Progress Notes (Signed)
Subjective:    Patient ID: Lisa CleaverLula B Whitner, female    DOB: [birth year], 80 y.o.   MRN: [id number]  HPI Pt returns with son for f/u of insulin-requiring DM (dx'ed 1988; she has mild if any neuropathy of the lower extremities, but she has associated CVA; she lives at [facility] [place]; son brings pt, and provides hx; he lives in [place], and spends 1 week a month here; her last episode of confirmed severe hypoglycemia was December, 2013, but several episodes in 2014 were suspected; these were believed to be due to missing meals after getting novolog; this was addressed with changing the novolog to after eating; she has never had DKA or pancreatitis; she takes multiple daily injections). pt states she feels well in general.  cbg record from the nursing home is reviewed, and scanned into epic.  She still has mild hypoglycemia in the afternoon, approx once a week.  It is most consistently high at lunch. Past Medical History  Diagnosis Date  . Depression   . Diabetes mellitus     Type I  . Hypertension   . Dementia   . Diverticulosis of colon   . History of CVA (cerebrovascular accident)   . PSORIASIS 11/29/2010    No past surgical history on file.  History   Social History  . Marital Status: Widowed    Spouse Name: N/A    Number of Children: N/A  . Years of Education: N/A   Occupational History  . Not on file.   Social History Main Topics  . Smoking status: Former Games developermoker  . Smokeless tobacco: Not on file  . Alcohol Use: Not on file  . Drug Use: Not on file  . Sexual Activity: Not on file   Other Topics Concern  . Not on file   Social History Narrative   Several sons have DM    Current Outpatient Prescriptions on File Prior to Visit  Medication Sig Dispense Refill  . aspirin 81 MG tablet Take 81 mg by mouth daily.        Marland Kitchen. atenolol (TENORMIN) 50 MG tablet Take 50 mg by mouth daily.        . benazepril (LOTENSIN) 20 MG tablet Take 20 mg by mouth daily.        .  divalproex (DEPAKOTE) 125 MG DR tablet Take 125 mg by mouth 2 (two) times daily.      Marland Kitchen. docusate sodium (COLACE) 100 MG capsule Take 100 mg by mouth at bedtime.        . furosemide (LASIX) 20 MG tablet Take 20 mg by mouth 2 (two) times daily.        . insulin aspart (NOVOLOG) 100 UNIT/ML injection Inject subcutaneously 3 times daily (just before each meal) 45-5-50 units      . insulin detemir (LEVEMIR) 100 UNIT/ML injection Inject 20 Units into the skin at bedtime.   10 mL    . loratadine (CLARITIN) 10 MG tablet Take 10 mg by mouth daily.        Marland Kitchen. PARoxetine (PAXIL) 30 MG tablet Take 30 mg by mouth daily.      . Skin Protectants, Misc. (EUCERIN) cream Apply topically as needed.        Marland Kitchen. tenormin (ATENOLOL) 12.5 mg TABS Take 12.5 mg by mouth at bedtime.        . [DISCONTINUED] insulin lispro (HUMALOG) 100 UNIT/ML injection Inject into the skin. Three times daily 45-33-105 units       No  current facility-administered medications on file prior to visit.    No Known Allergies  Family History  Problem Relation Age of Onset  . Diabetes Son     BP 110/60  Pulse 61  Temp(Src) 97.8 F (36.6 C) (Oral)  SpO2 96%    Review of Systems She has lost a few lbs; no recent LOC.      Objective:   Physical Exam Vital signs: see vs page Gen: elderly, frail, no distress.  In wheelchair. Pulses: dorsalis pedis intact bilat.  Feet: no deformity. feet are of normal color and temp. no edema. Multiple psoriatic plaques.  Skin: no ulcer on the feet.  Neuro: sensation is intact to touch on the feet.   i have reviewed the following outside records: Med list from NH: med list is reconciled.   A1c=7.5    Assessment & Plan:  DM: mild exacerbation Side-effect of treatment: hypoglycemia.  Must be minimized in patient of this age and health state. Weight loss: she may need a decreased insulin dosage.     Patient is advised the following: Patient Instructions  Please change the novolog to 3 times  daily (just before each meal) 45-5-50 units Please continue the same levemir.  If resident eats little or none, please skip that insulin dose.    Please make a follow-up appointment in 3 months.   check resident's blood sugar 4 times a day--before the 3 meals, and at bedtime.  also check if resident has symptoms of your blood sugar being too high or too low.  please keep a record of the readings and bring it to resident's next appointment here.  please call [place] sooner if resident has hypoglycemia, or if it stays over 200.

## 2014-04-21 ENCOUNTER — Ambulatory Visit: Payer: Medicare Other | Admitting: Endocrinology

## 2014-07-25 ENCOUNTER — Ambulatory Visit (INDEPENDENT_AMBULATORY_CARE_PROVIDER_SITE_OTHER): Payer: Medicare Other | Admitting: Endocrinology

## 2014-07-25 ENCOUNTER — Encounter: Payer: Self-pay | Admitting: Endocrinology

## 2014-07-25 VITALS — BP 108/62 | HR 82 | Temp 98.1°F

## 2014-07-25 DIAGNOSIS — E1059 Type 1 diabetes mellitus with other circulatory complications: Secondary | ICD-10-CM

## 2014-07-25 NOTE — Progress Notes (Signed)
Subjective:    Patient ID: Lisa Walls, female    DOB: July 25, 1923, 80 y.o.   MRN: [id number]  HPI Pt returns with son (who provides history) for f/u of insulin-requiring DM (dx'ed 1988; she has mild if any neuropathy of the lower extremities, but she has associated CVA; she lives at [facility] at [facility] (staff administers insulin injections; son brings pt, and provides hx; he lives in [place], and spends 1 week a month here; her last episode of confirmed severe hypoglycemia was December, 2013, but several episodes in 2014 were suspected; these were believed to be due to missing meals after getting novolog; this was addressed with changing the novolog to after eating; she has never had DKA or pancreatitis; she takes multiple daily injections). pt states she feels well in general.  cbg record from the nursing home is reviewed, and scanned into epic.  She still has mild hypoglycemia in the afternoon, but this is less since last ov-- approx once a month.  Past Medical History  Diagnosis Date  . Depression   . Diabetes mellitus     Type I  . Hypertension   . Dementia   . Diverticulosis of colon   . History of CVA (cerebrovascular accident)   . PSORIASIS 11/29/2010    No past surgical history on file.  History   Social History  . Marital Status: Widowed    Spouse Name: N/A    Number of Children: N/A  . Years of Education: N/A   Occupational History  . Not on file.   Social History Main Topics  . Smoking status: Former Games developer  . Smokeless tobacco: Not on file  . Alcohol Use: Not on file  . Drug Use: Not on file  . Sexual Activity: Not on file   Other Topics Concern  . Not on file   Social History Narrative   Several sons have DM    Current Outpatient Prescriptions on File Prior to Visit  Medication Sig Dispense Refill  . aspirin 81 MG tablet Take 81 mg by mouth daily.        Marland Kitchen atenolol (TENORMIN) 50 MG tablet Take 50 mg by mouth daily.        . benazepril (LOTENSIN) 20  MG tablet Take 20 mg by mouth daily.        . divalproex (DEPAKOTE) 125 MG DR tablet Take 125 mg by mouth 2 (two) times daily.      Marland Kitchen docusate sodium (COLACE) 100 MG capsule Take 100 mg by mouth at bedtime.        . furosemide (LASIX) 20 MG tablet Take 20 mg by mouth 2 (two) times daily.        . insulin aspart (NOVOLOG) 100 UNIT/ML injection 2 (two) times daily with a meal. 2 times daily (with each meal) 45-(none)-50 units.      . insulin detemir (LEVEMIR) 100 UNIT/ML injection Inject 20 Units into the skin at bedtime.   10 mL    . loratadine (CLARITIN) 10 MG tablet Take 10 mg by mouth daily.        Marland Kitchen PARoxetine (PAXIL) 30 MG tablet Take 30 mg by mouth daily.      . Skin Protectants, Misc. (EUCERIN) cream Apply topically as needed.        Marland Kitchen tenormin (ATENOLOL) 12.5 mg TABS Take 12.5 mg by mouth at bedtime.        . [DISCONTINUED] insulin lispro (HUMALOG) 100 UNIT/ML injection Inject into the skin. Three  times daily 45-33-105 units       No current facility-administered medications on file prior to visit.    No Known Allergies  Family History  Problem Relation Age of Onset  . Diabetes Son     BP 108/62  Pulse 82  Temp(Src) 98.1 F (36.7 C) (Oral)  SpO2 95%  Review of Systems Denies LOC.  She has lost a few lbs.      Objective:   Physical Exam Vital signs: see vs page Gen: elderly, frail, no distress.  In wheelchair Pulses: dorsalis pedis intact bilat.  Feet: no deformity. feet are of normal color and temp. no edema. Multiple psoriatic plaques.  Skin: no ulcer on the feet.  Neuro: sensation is intact to touch on the feet.    outside test results are reviewed: A1c=7.5  i have reviewed the following outside records: cbg record    Assessment & Plan:  DM: slightly overcontrolled Frail elderly state: in this context, avoidance of hypoglycemia is more important than a1c <7% Weight loss, new, uncertain etiology.  This may be reducing her insulin requirement.   Patient is  advised the following: Patient Instructions  Please change the novolog to 2 times daily (with each meal) 45-(none)-50 units. Please continue the same levemir.  If resident eats little or none, please skip that insulin dose.    Please make a follow-up appointment in 3 months.   check resident's blood sugar 4 times a day--before the 3 meals, and at bedtime.  also check if resident has symptoms of your blood sugar being too high or too low.  please keep a record of the readings and bring it to resident's next appointment here.  please call [place] sooner if resident has hypoglycemia, or if it stays over 200.

## 2014-07-25 NOTE — Patient Instructions (Signed)
Please change the novolog to 2 times daily (with each meal) 45-(none)-50 units. Please continue the same levemir.  If resident eats little or none, please skip that insulin dose.    Please make a follow-up appointment in 3 months.   check resident's blood sugar 4 times a day--before the 3 meals, and at bedtime.  also check if resident has symptoms of your blood sugar being too high or too low.  please keep a record of the readings and bring it to resident's next appointment here.  please call [place] sooner if resident has hypoglycemia, or if it stays over 200.

## 2014-10-26 ENCOUNTER — Ambulatory Visit: Payer: Medicare Other | Admitting: Endocrinology

## 2014-11-17 ENCOUNTER — Ambulatory Visit (INDEPENDENT_AMBULATORY_CARE_PROVIDER_SITE_OTHER): Payer: Medicare Other | Admitting: Endocrinology

## 2014-11-17 ENCOUNTER — Encounter: Payer: Self-pay | Admitting: Endocrinology

## 2014-11-17 VITALS — BP 128/76 | HR 61 | Temp 98.4°F | Ht 63.0 in

## 2014-11-17 DIAGNOSIS — E1051 Type 1 diabetes mellitus with diabetic peripheral angiopathy without gangrene: Secondary | ICD-10-CM

## 2014-11-17 DIAGNOSIS — E1059 Type 1 diabetes mellitus with other circulatory complications: Secondary | ICD-10-CM

## 2014-11-17 NOTE — Patient Instructions (Signed)
Please continue the same novolog Please reduce levemir to 15 units at bedtime.  If resident eats little or none, please skip that insulin dose.    Please make a follow-up appointment in 3 months.   check resident's blood sugar 4 times a day--before the 3 meals, and at bedtime.  also check if resident has symptoms of your blood sugar being too high or too low.  please keep a record of the readings and bring it to resident's next appointment here.  please call [place] sooner if resident has hypoglycemia, or if it stays over 200.

## 2014-11-17 NOTE — Progress Notes (Signed)
Subjective:    Patient ID: Lisa Walls, female    DOB: 11/08/1923, 81 y.o.   MRN: [id number]  HPI  Pt returns for f/u of diabetes mellitus: DM type: 1 Dx'ed: 1988 Complications: CVA Therapy: insulin for many years GDM: unknown DKA: never Severe hypoglycemia: last confirmed episode was December, 2013, but several episodes in 2014 were suspected.  these were believed to be due to missing meals after getting novolog; this was addressed with changing the novolog to after eating Pancreatitis: never Other: she lives at [facility] (staff administers multiple daily injections; son brings pt, and provides hx; he lives in [place], and spends 1 week a month here;  Interval history: pt states she feels well in general.  cbg record from the nursing home is reviewed, and scanned into epic.  She still has mild hypoglycemia in the afternoon approx once per month.    Past Medical History  Diagnosis Date  . Depression   . Diabetes mellitus     Type I  . Hypertension   . Dementia   . Diverticulosis of colon   . History of CVA (cerebrovascular accident)   . PSORIASIS 11/29/2010    No past surgical history on file.  History   Social History  . Marital Status: Widowed    Spouse Name: N/A    Number of Children: N/A  . Years of Education: N/A   Occupational History  . Not on file.   Social History Main Topics  . Smoking status: Former Games developer  . Smokeless tobacco: Not on file  . Alcohol Use: Not on file  . Drug Use: Not on file  . Sexual Activity: Not on file   Other Topics Concern  . Not on file   Social History Narrative   Several sons have DM    Current Outpatient Prescriptions on File Prior to Visit  Medication Sig Dispense Refill  . aspirin 81 MG tablet Take 81 mg by mouth daily.      Marland Kitchen atenolol (TENORMIN) 50 MG tablet Take 50 mg by mouth daily.      . benazepril (LOTENSIN) 20 MG tablet Take 20 mg by mouth daily.      . divalproex (DEPAKOTE) 125 MG DR tablet  Take 125 mg by mouth 2 (two) times daily.    Marland Kitchen docusate sodium (COLACE) 100 MG capsule Take 100 mg by mouth at bedtime.      . furosemide (LASIX) 20 MG tablet Take 20 mg by mouth 2 (two) times daily.      . insulin aspart (NOVOLOG) 100 UNIT/ML injection 2 (two) times daily with a meal. 2 times daily (with each meal) 45-(none)-50 units.    . insulin detemir (LEVEMIR) 100 UNIT/ML injection Inject 15 Units into the skin at bedtime.  10 mL   . loratadine (CLARITIN) 10 MG tablet Take 10 mg by mouth daily.      Marland Kitchen PARoxetine (PAXIL) 30 MG tablet Take 30 mg by mouth daily.    . Skin Protectants, Misc. (EUCERIN) cream Apply topically as needed.      Marland Kitchen tenormin (ATENOLOL) 12.5 mg TABS Take 12.5 mg by mouth at bedtime.      . [DISCONTINUED] insulin lispro (HUMALOG) 100 UNIT/ML injection Inject into the skin. Three times daily 45-33-105 units     No current facility-administered medications on file prior to visit.    No Known Allergies  Family History  Problem Relation Age of Onset  . Diabetes Son  BP 128/76 mmHg  Pulse 61  Temp(Src) 98.4 F (36.9 C) (Oral)  Ht 5\' 3"  (1.6 m)  SpO2 95%    Review of Systems Pt has lost a few lbs.  No LOC    Objective:   Physical Exam Vital signs: see vs page Gen: elderly, frail, no distress.  In wheelchair.   Pulses: dorsalis pedis intact bilat.  Feet: no deformity. feet are of normal color and temp. no edema. Multiple psoriatic plaques.  Skin: no ulcer on the feet.  Neuro: sensation is intact to touch on the feet.    outside test results are reviewed: A1c=7.0 (Nov, 2015)    Assessment & Plan:  DM: slightly overcontrolled.   Side-effect of rx: mild hypoglycemia. Frail elderly state: she is not a candidate for a1c < 7 in this setting.  Weight loss: this will reduce her insulin requirement.  Psoriasis: this raises the possibility that she has type 1 DM.     Patient is advised the following: Patient Instructions  Please continue the same  novolog Please reduce levemir to 15 units at bedtime.  If resident eats little or none, please skip that insulin dose.    Please make a follow-up appointment in 3 months.   check resident's blood sugar 4 times a day--before the 3 meals, and at bedtime.  also check if resident has symptoms of your blood sugar being too high or too low.  please keep a record of the readings and bring it to resident's next appointment here.  please call [place] sooner if resident has hypoglycemia, or if it stays over 200.

## 2014-11-24 ENCOUNTER — Telehealth: Payer: Self-pay | Admitting: Endocrinology

## 2014-11-24 NOTE — Telephone Encounter (Signed)
please call patient's facility Please continue the same insulin.

## 2014-11-27 NOTE — Telephone Encounter (Addendum)
Pt's facility advised of note below.  

## 2015-02-16 ENCOUNTER — Ambulatory Visit: Payer: Medicare Other | Admitting: Endocrinology

## 2015-02-19 ENCOUNTER — Ambulatory Visit: Payer: Medicare Other | Admitting: Endocrinology

## 2015-03-11 DEATH — deceased
